# Patient Record
Sex: Female | Born: 1970 | Race: Black or African American | Hispanic: No | Marital: Single | Smoking: Never smoker
Health system: Southern US, Community
[De-identification: ages and names within clinical notes are randomized; demographics above are authoritative.]

## PROBLEM LIST (undated history)

## (undated) DIAGNOSIS — E061 Subacute thyroiditis: Secondary | ICD-10-CM

## (undated) DIAGNOSIS — D649 Anemia, unspecified: Secondary | ICD-10-CM

---

## 1990-06-26 HISTORY — PX: WISDOM TOOTH EXTRACTION: SHX21

## 2000-03-30 ENCOUNTER — Other Ambulatory Visit: Admission: RE | Admit: 2000-03-30 | Discharge: 2000-03-30 | Payer: Self-pay | Admitting: Obstetrics and Gynecology

## 2001-06-03 ENCOUNTER — Other Ambulatory Visit: Admission: RE | Admit: 2001-06-03 | Discharge: 2001-06-03 | Payer: Self-pay | Admitting: Obstetrics and Gynecology

## 2002-05-28 ENCOUNTER — Other Ambulatory Visit: Admission: RE | Admit: 2002-05-28 | Discharge: 2002-05-28 | Payer: Self-pay | Admitting: Obstetrics and Gynecology

## 2003-07-28 ENCOUNTER — Other Ambulatory Visit: Admission: RE | Admit: 2003-07-28 | Discharge: 2003-07-28 | Payer: Self-pay | Admitting: Obstetrics and Gynecology

## 2004-03-16 ENCOUNTER — Other Ambulatory Visit: Admission: RE | Admit: 2004-03-16 | Discharge: 2004-03-16 | Payer: Self-pay | Admitting: Obstetrics and Gynecology

## 2004-10-18 ENCOUNTER — Other Ambulatory Visit: Admission: RE | Admit: 2004-10-18 | Discharge: 2004-10-18 | Payer: Self-pay | Admitting: Obstetrics and Gynecology

## 2007-06-27 HISTORY — PX: MYOMECTOMY: SHX85

## 2007-10-10 ENCOUNTER — Inpatient Hospital Stay (HOSPITAL_COMMUNITY): Admission: RE | Admit: 2007-10-10 | Discharge: 2007-10-12 | Payer: Self-pay | Admitting: Obstetrics and Gynecology

## 2007-10-10 ENCOUNTER — Encounter (INDEPENDENT_AMBULATORY_CARE_PROVIDER_SITE_OTHER): Payer: Self-pay | Admitting: Obstetrics and Gynecology

## 2010-11-08 NOTE — Discharge Summary (Signed)
Christy Rice, Christy Rice                  ACCOUNT NO.:  [id number]   MEDICAL RECORD NO.:  [id number]          PATIENT TYPE:  INP   LOCATION:  9312                          FACILITY:  [facility]   PHYSICIAN:  Dineen Kid. Rana Snare, M.D.    DATE OF BIRTH:  [birth year]   DATE OF ADMISSION:  10/10/2007  DATE OF DISCHARGE:  10/12/2007                               DISCHARGE SUMMARY   HISTORY OF PRESENT ILLNESS:  Christy Rice is a 40 year old nulligravida  with a worsening dysmenorrhea, pelvic pressure pain, and menorrhagia.  She has 12-week size fibroid uterus on pelvic exam and also ultrasound  evaluation with the large fibroid measured 6.9 cm in size and multiple 3-  cm size fibroid.  She presents for laparotomy with myomectomy.   HOSPITAL COURSE:  The patient underwent a laparotomy with multiple  myomectomies.  Surgery was uncomplicated.  Her estimated blood loss  during the procedure was 600 mL.  Her postoperative care was significant  for anemia with her postop day #1, hemoglobin 8.2 with mild  thrombocytopenia of 143.  On postop day #1, she was able to ambulate  without difficulty.  She did complain of some bladder spasm and pain  with urination.  Cath urine was negative and she remained afebrile.  Had  a normal white count.  She was put on Pyridium with good results.  By  postop day #2, she was tolerating a regular diet, passing flatus,  ambulating without difficulty.  Abdomen:  Soft, nontender, and  nondistended.  No rebound or guarding.  The incisions clean, dry, and  intact.  Her hemoglobin on postop day #2, had dropped slightly to 7.5,  platelets of 133.  The patient did desired discharge home and so was  released.   DISPOSITION:  The patient was discharged to home.  Followup in the  office in 2-3 days for follow up CBC and staple removal.  She was sent  home with a prescription for Tylox #30, Motrin 18 mg #30 and iron twice  a day.  I told to return for increased pain, fever, or bleeding.      Dineen Kid Rana Snare, M.D.  Electronically Signed     DCL/MEDQ  D:  10/12/2007  T:  10/12/2007  Job:  [id number]

## 2010-11-08 NOTE — Op Note (Signed)
NAMEABBIEGAIL, LANDGREN                  ACCOUNT NO.:  [id number]   MEDICAL RECORD NO.:  [id number]          PATIENT TYPE:  AMB   LOCATION:  SDC                           FACILITY:  [facility]   PHYSICIAN:  Dineen Kid. Rana Snare, M.D.    DATE OF BIRTH:  July 21, 1970   DATE OF PROCEDURE:  10/10/2007  DATE OF DISCHARGE:                               OPERATIVE REPORT   PREOPERATIVE DIAGNOSES:  Menorrhagia, pelvic pain, and 12-week size  fibroids and pelvic pressure.   PREOPERATIVE DIAGNOSES:  Menorrhagia, pelvic pain, and 12-week size  fibroids and pelvic pressure.   PROCEDURE:  Laparotomy with myomectomy.   SURGEON:  Dineen Kid. Rana Snare, M.D.   ASSISTANTFreddy Finner, M.D.   ANESTHESIA:  General endotracheal.   INDICATIONS:  Ms. Phillis is a 40 year old nulligravida with worsening  problems with dysmenorrhea, pelvic pressure, with pelvic pain and  menorrhagia, 4-week size fibroids based on pelvic exam and also an  ultrasound evaluation, with the largest fibroid measuring 6.9 cm in size  and several 3 cm fibroids.  She desires preservation of the uterus and  presents today for definitive surgical treatment plan with laparotomy  with myomectomy.  The risks and benefits of this procedure were  discussed at length.  Informed consent was obtained.  See history and  physical for further details.   FINDINGS AT THE TIME OF SURGERY:  Multiple fibroids, the largest was  approximately 7 cm in size which was intramural with extension to the  myometrium, several 3 cm fibroids, and several 1 and 2 cm fibroids.  Otherwise normal-appearing fallopian tubes and ovaries.   DESCRIPTION OF PROCEDURE:  After adequate analgesia the patient was  placed in the supine position.  She was sterilely prepped and draped.  Bladder was sterilely drained with a Foley catheter.  A Pfannenstiel  skin incision was made 2 fingerbreadths above the pubic symphysis and  taken down sharply to fascia which was incised transversely and  extended  superiorly and inferiorly off the bellies of the rectus muscle which was  separated sharply in the midline.  Peritoneum was entered sharply.  Examination of the uterus and fallopian tubes was carried out with  normal-appearing ovaries and fallopian tubes.  The fundus of the uterus  was grasped and delivered through the incision and wet packs were placed  around the inferior portion of the uterus.  A 3 cm fibroid was noted  just to the left of the midline below the bladder flap.  A small bladder  flap was created with Metzenbaum scissors.  The fibroid was dissected  using Bovie cautery, grasped with the towel clamp, and the base was  dissected out with Bovie cautery.  Sutures of 0 Monocryl were used to  close the myotomy with good approximation and good hemostasis achieved.  The uterine vesical flap was closed using 2-0 Monocryl.  Several small  subserosal and intramural fibroids were noted along the anterior  surface, were dissected using Bovie cautery, removed after dissection,  and then closed with figure-of-eights of 0 Monocryl suture.  A vertical  incision across the  fundus of the uterus approximately 7-8 cm in length  was used to dissect down to the largest fibroid, was grasped with a  towel clamp, approximately 7-8 cm in size.  It was easily removed, the  base cauterized with the Bovie cautery.  Several small 1-3 cm fibroids  were also noted through that incision and were easily removed through  the same incision using Bovie cautery for hemostasis.  The endometrium  was noted at the base of where the largest fibroid was removed.  It was  entered at the fundal portion of the endometrium.  The endometrium was  grasped with Allis clamps and was closed with a 0 Monocryl suture.  The  intramural portion of the myotomy was then closed with figure-of-eights  of 0 Monocryl suture with good approximation and good hemostasis  achieved, and then the serosal layer was closed with a  running 2-0  Monocryl suture, with good approximation and good hemostasis noted.  Examination of the entire uterus at this time did not reveal any further  fibroids.  The fallopian tube did appear to enter below where the  largest fibroid was and did not appear to be near the area of  dissection.  After a copious amount of irrigation, adequate hemostasis  was assured.  Interceed adhesion barrier was placed across the  incisions.  The uterus was placed back into the abdominal cavity.  The  peritoneum was then closed with 2-0 Monocryl in a running fashion.  Rectus muscle plicated in the midline.  The fascia was then closed with  2 sutures of 0 Vicryl in a running fashion.  Irrigation was applied and  after adequate hemostasis skin staples.  The patient tolerated the  procedure well and was stable on transfer to the recovery room.  Sponge  and needle count was normal x3.  The patient received 900 mg of  clindamycin preoperatively and also Flagyl preoperatively.   BLOOD LOSS FOR THE PROCEDURE:  600 mL.   SPONGE AND NEEDLE COUNTS:  Normal x3.      Dineen Kid Rana Snare, M.D.  Electronically Signed     DCL/MEDQ  D:  10/10/2007  T:  10/10/2007  Job:  [id number]

## 2010-11-08 NOTE — H&P (Signed)
NAMEICIS, Christy Rice                  ACCOUNT NO.:  [id number]   MEDICAL RECORD NO.:  [id number]           PATIENT TYPE:   LOCATION:                                 FACILITY:   PHYSICIAN:  Dineen Kid. Rana Snare, M.D.    DATE OF BIRTH:  06/20/1971   DATE OF ADMISSION:  10/10/2007  DATE OF DISCHARGE:                              HISTORY & PHYSICAL   HISTORY OF PRESENT ILLNESS:  Christy Rice is a 40 year old gravida 0 with  worsening problems with dysmenorrhea, pelvic pressure and menorrhagia.  She has 12-week size fibroids based on pelvic exam and also on  ultrasound evaluation.  She desires definitive surgical intervention.  She desires preservation of the uterus and presents for laparotomy with  myomectomy.  She underwent a saline infusion ultrasound on August 15, 2007 which shows multiple fibroids, the largest measuring 6.9 cm in size  with several 3-cm fibroids.  Most of these are intramural.  She does  have several subserosal fibroids measuring 3.6 cm in size.  There are no  intracavitary masses, however, and the ovaries appear to be normal.   PAST MEDICAL HISTORY:  Negative.   PAST SURGICAL HISTORY:  Negative.   ALLERGIES:  SHE DOES HAVE AN ALLERGY TO AMOXICILLIN AND SULFA.  HOWEVER,  SHE HAS BEEN ABLE TO TAKE CEPHALOSPORINS WITHOUT PROBLEMS.   Currently she is on birth control pills.   PHYSICAL EXAM:  Her blood pressure is 124/70.  Her weight is 189.  HEART:  Regular rate and rhythm.  LUNGS:  Clear to auscultation bilaterally.  The abdomen was nondistended, nontender.  PELVIC EXAM:  The uterus is felt just above the pubic brim with multiple  fibroids noted.   IMPRESSION AND PLAN:  Dysmenorrhea, menorrhagia, pelvic pressure, 12-  week size fibroids.  The patient desires definitive surgical  intervention.  Would plan laparotomy with myomectomy.  The risks and  benefits of the procedure were discussed at length which include but are  not limited to risk of infection, bleeding,  damage to the uterus, tubes,  ovaries,     bowel, bladder.  Possibly this may not alleviate the pain;  it could recur, it could worsen, fibroids can grow back.  There is a  potential for scar tissue.  Potential need for hysterectomy, blood  transfusion, and the risks associated with anesthesia.  She also  understands that if she does conceive she will require a cesarean  section.  She does give her informed consent and wishes to proceed.  Her  last hemoglobin was 14.      Dineen Kid Rana Snare, M.D.  Electronically Signed    DCL/MEDQ  D:  10/09/2007  T:  10/10/2007  Job:  [id number]

## 2011-03-21 LAB — URINALYSIS, ROUTINE W REFLEX MICROSCOPIC
Bilirubin Urine: NEGATIVE
Glucose, UA: NEGATIVE
Hgb urine dipstick: NEGATIVE
Ketones, ur: NEGATIVE
Nitrite: NEGATIVE
Specific Gravity, Urine: <1.005 — ABNORMAL LOW
pH: 5.5

## 2011-03-21 LAB — COMPREHENSIVE METABOLIC PANEL
Albumin: 3.9
Alkaline Phosphatase: 50
BUN: 7
Calcium: 9.5
Potassium: 4
Sodium: 139
Total Protein: 7.4

## 2011-03-21 LAB — CBC
HCT: 21.7 — ABNORMAL LOW
HCT: 23.9 — ABNORMAL LOW
Hemoglobin: 8.2 — ABNORMAL LOW
MCHC: 33.7
MCV: 84.3
Platelets: 133 — ABNORMAL LOW
Platelets: 143 — ABNORMAL LOW
Platelets: 230
RDW: 13.4
WBC: 10.6 — ABNORMAL HIGH
WBC: 6.6

## 2011-03-21 LAB — PREGNANCY, URINE: Preg Test, Ur: NEGATIVE

## 2012-07-16 ENCOUNTER — Other Ambulatory Visit (HOSPITAL_COMMUNITY): Payer: Self-pay | Admitting: Obstetrics and Gynecology

## 2012-07-16 DIAGNOSIS — N979 Female infertility, unspecified: Secondary | ICD-10-CM

## 2012-07-18 ENCOUNTER — Ambulatory Visit (HOSPITAL_COMMUNITY)
Admission: RE | Admit: 2012-07-18 | Discharge: 2012-07-18 | Disposition: A | Discharge status: Home / Self Care | Payer: 59 | Source: Ambulatory Visit | Attending: Obstetrics and Gynecology | Admitting: Obstetrics and Gynecology

## 2012-07-18 DIAGNOSIS — N979 Female infertility, unspecified: Secondary | ICD-10-CM | POA: Insufficient documentation

## 2012-07-18 MED ORDER — IOHEXOL 300 MG/ML  SOLN
20.0000 mL | Freq: Once | INTRAMUSCULAR | Status: AC | PRN
  Administered 2012-07-18: 20 mL

## 2013-02-21 ENCOUNTER — Encounter: Payer: 59 | Admitting: Nurse Practitioner

## 2013-04-30 ENCOUNTER — Other Ambulatory Visit: Payer: Self-pay | Admitting: Nurse Practitioner

## 2013-04-30 DIAGNOSIS — Z0000 Encounter for general adult medical examination without abnormal findings: Secondary | ICD-10-CM

## 2013-05-01 ENCOUNTER — Other Ambulatory Visit (INDEPENDENT_AMBULATORY_CARE_PROVIDER_SITE_OTHER): Payer: 59

## 2013-05-01 DIAGNOSIS — Z0000 Encounter for general adult medical examination without abnormal findings: Secondary | ICD-10-CM

## 2013-05-01 LAB — POCT CBC
MCHC: 33.2 g/dL (ref 31.8–35.4)
MPV: 9.2 fL (ref 0–99.8)
POC Granulocyte: 2.5 (ref 2–6.9)
POC LYMPH PERCENT: 38.2 %L (ref 10–50)
Platelet Count, POC: 184 10*3/uL (ref 142–424)
RDW, POC: 13.4 %
WBC: 4.3 10*3/uL — AB (ref 4.6–10.2)

## 2013-05-03 LAB — CMP14+EGFR
ALT: 8 IU/L (ref 0–32)
Calcium: 9.7 mg/dL (ref 8.7–10.2)
Chloride: 104 mmol/L (ref 97–108)
Glucose: 101 mg/dL — ABNORMAL HIGH (ref 65–99)
Potassium: 5.1 mmol/L (ref 3.5–5.2)
Total Protein: 6.7 g/dL (ref 6.0–8.5)

## 2013-05-03 LAB — NMR, LIPOPROFILE
HDL Cholesterol by NMR: 43 mg/dL (ref >=40)
LDL Particle Number: 1933 nmol/L — ABNORMAL HIGH (ref <1000)
LDL Size: 20.6 nm (ref >20.5)
Triglycerides by NMR: 103 mg/dL (ref <150)

## 2013-06-17 ENCOUNTER — Ambulatory Visit: Payer: 59 | Admitting: Family Medicine

## 2013-09-09 ENCOUNTER — Ambulatory Visit (INDEPENDENT_AMBULATORY_CARE_PROVIDER_SITE_OTHER): Payer: 59

## 2013-09-09 DIAGNOSIS — Z111 Encounter for screening for respiratory tuberculosis: Secondary | ICD-10-CM

## 2013-09-11 ENCOUNTER — Encounter: Payer: Self-pay | Admitting: *Deleted

## 2013-09-11 LAB — TB SKIN TEST
Induration: 0 mm
TB Skin Test: NEGATIVE

## 2014-04-10 ENCOUNTER — Encounter: Payer: Self-pay | Admitting: *Deleted

## 2014-10-30 ENCOUNTER — Other Ambulatory Visit (HOSPITAL_COMMUNITY): Payer: Self-pay | Admitting: Obstetrics and Gynecology

## 2014-11-02 LAB — CYTOLOGY - PAP

## 2014-11-17 ENCOUNTER — Other Ambulatory Visit: Payer: Self-pay | Admitting: Obstetrics and Gynecology

## 2015-07-25 ENCOUNTER — Emergency Department
Admission: EM | Admit: 2015-07-25 | Discharge: 2015-07-25 | Disposition: A | Discharge status: Home / Self Care | Payer: BLUE CROSS/BLUE SHIELD | Source: Home / Self Care | Attending: Family Medicine | Admitting: Family Medicine

## 2015-07-25 ENCOUNTER — Encounter: Payer: Self-pay | Admitting: Emergency Medicine

## 2015-07-25 DIAGNOSIS — J069 Acute upper respiratory infection, unspecified: Secondary | ICD-10-CM | POA: Diagnosis not present

## 2015-07-25 DIAGNOSIS — H65191 Other acute nonsuppurative otitis media, right ear: Secondary | ICD-10-CM

## 2015-07-25 MED ORDER — FLUTICASONE PROPIONATE 50 MCG/ACT NA SUSP: 2.0000 | Freq: Every day | NASAL | Status: DC

## 2015-07-25 MED ORDER — PSEUDOEPHEDRINE HCL 60 MG PO TABS: 60.0000 mg | ORAL_TABLET | Freq: Four times a day (QID) | ORAL | Status: DC | PRN

## 2015-07-25 MED ORDER — AMOXICILLIN-POT CLAVULANATE 875-125 MG PO TABS: 1.0000 | ORAL_TABLET | Freq: Two times a day (BID) | ORAL | Status: DC

## 2015-07-25 NOTE — ED Notes (Signed)
Pt c/o bilateral ear pain, right ear worse x 4 days, head congestion, facial pressure, no cough.

## 2015-07-25 NOTE — ED Provider Notes (Signed)
CSN: [id number]     Arrival date & time 07/25/15  1105 History   First MD Initiated Contact with Patient 07/25/15 1108     Chief Complaint  Patient presents with  . Otalgia   (Consider location/radiation/quality/duration/timing/severity/associated sxs/prior Treatment) HPI  Pt is a 45yo female presenting to [facility] with c/o 4-5 day hx of gradually worsening sinus congestion with bilateral ear pain, worse in Right ear, aching and throbbing, associated facial pressure.  Pain is 4/10 at worst. Denies cough, chest pain or SOB.  She has had chills but no recorded fever. Denies n/v/d.  She has been taking ibuprofen with temporary relief. No sick contacts or recent travel.   History reviewed. No pertinent past medical history. History reviewed. No pertinent past surgical history. No family history on file. Social History  Substance Use Topics  . Smoking status: Never Smoker   . Smokeless tobacco: None  . Alcohol Use: None   OB History    No data available     Review of Systems  Constitutional: Positive for chills. Negative for fever.  HENT: Positive for congestion, ear pain (Right worse than Left), postnasal drip, rhinorrhea and sinus pressure. Negative for sore throat, trouble swallowing and voice change.   Respiratory: Negative for cough and shortness of breath.   Cardiovascular: Negative for chest pain and palpitations.  Gastrointestinal: Negative for nausea, vomiting, abdominal pain and diarrhea.  Musculoskeletal: Negative for myalgias, back pain and arthralgias.  Skin: Negative for rash.    Allergies  Sulfa antibiotics  Home Medications   Prior to Admission medications   Medication Sig Start Date End Date Taking? Authorizing Provider  Multiple Vitamin (MULTIVITAMIN) tablet Take 1 tablet by mouth daily.   Yes Historical Provider, MD  amoxicillin-clavulanate (AUGMENTIN) 875-125 MG tablet Take 1 tablet by mouth 2 (two) times daily. One po bid x 7 days 07/25/15   Noland Fordyce, PA-C   fluticasone [facility]) 50 MCG/ACT nasal spray Place 2 sprays into both nostrils daily. 07/25/15   Noland Fordyce, PA-C  pseudoephedrine (SUDAFED) 60 MG tablet Take 1 tablet (60 mg total) by mouth every 6 (six) hours as needed for congestion. 07/25/15   Noland Fordyce, PA-C   Meds Ordered and Administered this Visit  Medications - No data to display  BP 136/77 mmHg  Pulse 105  Temp(Src) 99.3 F (37.4 C) (Oral)  Ht 5\' 5"  (1.651 m)  Wt 193 lb 4 oz (87.658 kg)  BMI 32.16 kg/m2  SpO2 98%  LMP 07/23/2015 No data found.   Physical Exam  Constitutional: She appears well-developed and well-nourished. No distress.  HENT:  Head: Normocephalic and atraumatic.  Right Ear: Hearing, external ear and ear canal normal. Tympanic membrane is injected.  Left Ear: Hearing, tympanic membrane, external ear and ear canal normal.  Nose: Mucosal edema and rhinorrhea present. Right sinus exhibits maxillary sinus tenderness. Right sinus exhibits no frontal sinus tenderness. Left sinus exhibits no maxillary sinus tenderness and no frontal sinus tenderness.  Mouth/Throat: Uvula is midline, oropharynx is clear and moist and mucous membranes are normal.  Eyes: Conjunctivae are normal. No scleral icterus.  Neck: Normal range of motion. Neck supple.  Cardiovascular: Normal rate, regular rhythm and normal heart sounds.   Pulmonary/Chest: Effort normal and breath sounds normal. No stridor. No respiratory distress. She has no wheezes. She has no rales. She exhibits no tenderness.  Abdominal: Soft. She exhibits no distension. There is no tenderness.  Musculoskeletal: Normal range of motion.  Lymphadenopathy:    She has cervical adenopathy.  Neurological: She is alert.  Skin: Skin is warm and dry. She is not diaphoretic.  Nursing note and vitals reviewed.   ED Course  Procedures (including critical care time)  Labs Review Labs Reviewed - No data to display  Imaging Review No results found.    Tympanometry:  Right ear- Positive Tympanometric peak pressure.  Left ear- normal  MDM   1. Acute nonsuppurative otitis media of right ear   2. Acute upper respiratory infection     Pt c/o 4-5 days of worsening nasal congestion with bilateral ear pain, worse in Right ear.  Tympanometry c/w early onset AOM   Rx: augmentin, flonase, and sudafed.  Advised pt to use acetaminophen and ibuprofen as needed for fever and pain. Encouraged rest and fluids. F/u with PCP in 7-10 days if not improving, sooner if worsening. Pt verbalized understanding and agreement with tx plan.     Noland Fordyce, PA-C 07/25/15 1141

## 2015-07-25 NOTE — Discharge Instructions (Signed)
You may take 400-600mg Ibuprofen (Motrin) every 6-8 hours for fever and pain  °Alternate with Tylenol  °You may take 500mg Tylenol every 4-6 hours as needed for fever and pain  °Follow-up with your primary care provider next week for recheck of symptoms if not improving.  °Be sure to drink plenty of fluids and rest, at least 8hrs of sleep a night, preferably more while you are sick. °Return urgent care or go to closest ER if you cannot keep down fluids/signs of dehydration, fever not reducing with Tylenol, difficulty breathing/wheezing, stiff neck, worsening condition, or other concerns (see below)  °Please take antibiotics as prescribed and be sure to complete entire course even if you start to feel better to ensure infection does not come back. ° ° °Cool Mist Vaporizers °Vaporizers may help relieve the symptoms of a cough and cold. They add moisture to the air, which helps mucus to become thinner and less sticky. This makes it easier to breathe and cough up secretions. Cool mist vaporizers do not cause serious burns like hot mist vaporizers, which may also be called steamers or humidifiers. Vaporizers have not been proven to help with colds. You should not use a vaporizer if you are allergic to mold. °HOME CARE INSTRUCTIONS °· Follow the package instructions for the vaporizer. °· Do not use anything other than distilled water in the vaporizer. °· Do not run the vaporizer all of the time. This can cause mold or bacteria to grow in the vaporizer. °· Clean the vaporizer after each time it is used. °· Clean and dry the vaporizer well before storing it. °· Stop using the vaporizer if worsening respiratory symptoms develop. °  °This information is not intended to replace advice given to you by your health care provider. Make sure you discuss any questions you have with your health care provider. °  °Document Released: 03/09/2004 Document Revised: 06/17/2013 Document Reviewed: 10/30/2012 °Elsevier Interactive Patient  Education ©2016 [employer] ° °

## 2015-07-30 ENCOUNTER — Emergency Department
Admission: EM | Admit: 2015-07-30 | Discharge: 2015-07-30 | Disposition: A | Discharge status: Home / Self Care | Payer: Self-pay | Source: Home / Self Care

## 2015-07-30 DIAGNOSIS — Z111 Encounter for screening for respiratory tuberculosis: Secondary | ICD-10-CM

## 2015-07-30 NOTE — ED Notes (Signed)
patient is here fore PPD placement, placed in LFA.

## 2015-08-01 ENCOUNTER — Emergency Department
Admission: EM | Admit: 2015-08-01 | Discharge: 2015-08-01 | Disposition: A | Discharge status: Home / Self Care | Payer: BLUE CROSS/BLUE SHIELD | Source: Home / Self Care

## 2015-08-01 MED ORDER — TUBERCULIN PPD 5 UNIT/0.1ML ID SOLN: 5.0000 [IU] | Freq: Once | INTRADERMAL | Status: DC

## 2015-08-01 NOTE — ED Notes (Signed)
Ppd reading

## 2015-09-09 NOTE — Patient Instructions (Signed)
Your procedure is scheduled on:  Tuesday, September 21, 2015  Enter through the Main Entrance of [facility] at:  6:00 AM  Pick up the phone at the desk and dial [phone].  Call this number if you have problems the morning of surgery: [phone].  Remember: Do NOT eat food or drink after:  Midnight Monday  Take these medicines the morning of surgery with a SIP OF WATER:  None  Do NOT wear jewelry (body piercing), metal hair clips/bobby pins, make-up, or nail polish. Do NOT wear lotions, powders, or perfumes.  You may wear deoderant. Do NOT shave for 48 hours prior to surgery. Do NOT bring valuables to the hospital. Contacts, dentures, or bridgework may not be worn into surgery.  Leave suitcase in car.  After surgery it may be brought to your room.  For patients admitted to the hospital, checkout time is 11:00 AM the day of discharge.

## 2015-09-10 ENCOUNTER — Encounter (HOSPITAL_COMMUNITY): Payer: Self-pay

## 2015-09-10 ENCOUNTER — Encounter (HOSPITAL_COMMUNITY)
Admission: RE | Admit: 2015-09-10 | Discharge: 2015-09-10 | Disposition: A | Discharge status: Home / Self Care | Payer: BLUE CROSS/BLUE SHIELD | Source: Ambulatory Visit | Attending: Obstetrics and Gynecology | Admitting: Obstetrics and Gynecology

## 2015-09-10 DIAGNOSIS — D259 Leiomyoma of uterus, unspecified: Secondary | ICD-10-CM | POA: Insufficient documentation

## 2015-09-10 DIAGNOSIS — Z01812 Encounter for preprocedural laboratory examination: Secondary | ICD-10-CM | POA: Insufficient documentation

## 2015-09-10 DIAGNOSIS — N939 Abnormal uterine and vaginal bleeding, unspecified: Secondary | ICD-10-CM | POA: Diagnosis not present

## 2015-09-10 DIAGNOSIS — Z01810 Encounter for preprocedural cardiovascular examination: Secondary | ICD-10-CM | POA: Insufficient documentation

## 2015-09-10 HISTORY — DX: Anemia, unspecified: D64.9

## 2015-09-10 HISTORY — DX: Subacute thyroiditis: E06.1

## 2015-09-10 LAB — CBC
HEMATOCRIT: 36.4 % (ref 36.0–46.0)
Hemoglobin: 11.7 g/dL — ABNORMAL LOW (ref 12.0–15.0)
MCH: 26.8 pg (ref 26.0–34.0)
MCHC: 32.1 g/dL (ref 30.0–36.0)
MCV: 83.5 fL (ref 78.0–100.0)
PLATELETS: 272 10*3/uL (ref 150–400)
RBC: 4.36 MIL/uL (ref 3.87–5.11)
RDW: 15.5 % (ref 11.5–15.5)
WBC: 3.4 10*3/uL — AB (ref 4.0–10.5)

## 2015-09-10 LAB — ABO/RH: ABO/RH(D): A POS

## 2015-09-16 ENCOUNTER — Emergency Department (INDEPENDENT_AMBULATORY_CARE_PROVIDER_SITE_OTHER)
Admission: EM | Admit: 2015-09-16 | Discharge: 2015-09-16 | Disposition: A | Discharge status: Home / Self Care | Payer: BLUE CROSS/BLUE SHIELD | Source: Home / Self Care | Attending: Family Medicine | Admitting: Family Medicine

## 2015-09-16 ENCOUNTER — Emergency Department (INDEPENDENT_AMBULATORY_CARE_PROVIDER_SITE_OTHER): Payer: BLUE CROSS/BLUE SHIELD

## 2015-09-16 DIAGNOSIS — X58XXXA Exposure to other specified factors, initial encounter: Secondary | ICD-10-CM

## 2015-09-16 DIAGNOSIS — S92912A Unspecified fracture of left toe(s), initial encounter for closed fracture: Secondary | ICD-10-CM

## 2015-09-16 DIAGNOSIS — S92512A Displaced fracture of proximal phalanx of left lesser toe(s), initial encounter for closed fracture: Secondary | ICD-10-CM

## 2015-09-16 NOTE — ED Provider Notes (Signed)
CSN: [id number]     Arrival date & time 09/16/15  1759 History   First MD Initiated Contact with Patient 09/16/15 1840     Chief Complaint  Patient presents with  . Fall      HPI Comments: Patient slipped on stairs at 10pm yesterday, injuring her left foot.  Patient is a 45 y.o. female presenting with foot injury. The history is provided by the patient.  Foot Injury Location:  Foot Time since incident:  20 hours Injury: yes   Foot location:  L foot Pain details:    Quality:  Aching   Radiates to:  Does not radiate   Severity:  Moderate   Onset quality:  Gradual   Duration:  20 hours   Timing:  Constant   Progression:  Unchanged Chronicity:  New Prior injury to area:  No Worsened by:  Bearing weight Ineffective treatments:  None tried Associated symptoms: decreased ROM, stiffness and swelling   Associated symptoms: no back pain, no muscle weakness, no numbness and no tingling     Past Medical History  Diagnosis Date  . Thyroiditis, subacute   . Anemia    Past Surgical History  Procedure Laterality Date  . Myomectomy  2009  . Wisdom tooth extraction  1992   History reviewed. No pertinent family history. Social History  Substance Use Topics  . Smoking status: Never Smoker   . Smokeless tobacco: Never Used  . Alcohol Use: No   OB History    No data available     Review of Systems  Musculoskeletal: Positive for stiffness. Negative for back pain.  All other systems reviewed and are negative.   Allergies  Sulfa antibiotics  Home Medications   Prior to Admission medications   Medication Sig Start Date End Date Taking? Authorizing Provider  Calcium 500 MG CHEW Chew 1 tablet by mouth 3 (three) times a week.    Historical Provider, MD  Ergocalciferol (VITAMIN D2) 2000 units TABS Take 1 tablet by mouth daily.    Historical Provider, MD  fluticasone (FLONASE) 50 MCG/ACT nasal spray Place 2 sprays into both nostrils daily. Patient not taking: Reported on 09/03/2015  07/25/15   Noland Fordyce, PA-C  Multiple Vitamin (MULTIVITAMIN) tablet Take 1 tablet by mouth daily.    Historical Provider, MD   Meds Ordered and Administered this Visit  Medications - No data to display  BP 116/79 mmHg  Pulse 87  Temp(Src) 98.3 F (36.8 C) (Oral)  Ht 5\' 5"  (1.651 m)  Wt 186 lb 8 oz (84.596 kg)  BMI 31.04 kg/m2  SpO2 98%  LMP 09/10/2015 (Exact Date) No data found.   Physical Exam  Constitutional: She is oriented to person, place, and time. She appears well-developed and well-nourished. No distress.  HENT:  Head: Atraumatic.  Eyes: Pupils are equal, round, and reactive to light.  Musculoskeletal:       Left foot: There is decreased range of motion, tenderness, bony tenderness and swelling. There is normal capillary refill, no crepitus, no deformity and no laceration.       Feet:  There is tenderness to palpation and swelling over the left fifth toe and MTP joint.  Distal neurovascular function is intact.   Neurological: She is alert and oriented to person, place, and time.  Skin: Skin is warm and dry.  Nursing note and vitals reviewed.   ED Course  Procedures none   Imaging Review Dg Foot Complete Left  09/16/2015  CLINICAL DATA:  [place] [address] on stairs  last night with pain 4th to 5th digits EXAM: LEFT FOOT - COMPLETE 3+ VIEW COMPARISON:  None. FINDINGS: There is an insignificantly displaced fracture involving the medial base of the fifth proximal phalanx. Fracture line extends into the metatarsophalangeal joint. There is also an insignificantly displaced fracture involving the medial base of the medial phalanx with fracture extending into the proximal interphalangeal joint. IMPRESSION: Fractures of the fifth proximal and middle phalanges. Electronically Signed   By: Skipper Cliche M.D.   On: 09/16/2015 18:53      MDM   1. Closed fracture of proximal phalanx of toe of left foot    Ace wrap applied.  Dispensed post-op shoe. Apply ice pack for 15 minutes every 1  to 2 hours today and tomorrow.  Elevate.  Wear Ace wrap until swelling decreases.  Wear post-op shoe for protection.  May continue ibuprofen. Followup with Dr. Aundria Mems or Dr. Lynne Leader ([facility]) in about one week.    Kandra Nicolas, MD 09/23/15 1215

## 2015-09-16 NOTE — Discharge Instructions (Signed)
Apply ice pack for 15 minutes every 1 to 2 hours today and tomorrow.  Elevate.  Wear Ace wrap until swelling decreases.  Wear post-op shoe for protection.  May continue ibuprofen.   Toe Fracture A toe fracture is a break in one of the toe bones (phalanges). CAUSES This condition may be caused by:  Dropping a heavy object on your toe.  Stubbing your toe.  Overusing your toe or doing repetitive exercise.  Twisting or stretching your toe out of place. RISK FACTORS This condition is more likely to develop in people who:  Play contact sports.  Have a bone disease.  Have a low calcium level. SYMPTOMS The main symptoms of this condition are swelling and pain in the toe. The pain may get worse with standing or walking. Other symptoms include:  Bruising.  Stiffness.  Numbness.  A change in the way the toe looks.  Broken bones that poke through the skin.  Blood beneath the toenail. DIAGNOSIS This condition is diagnosed with a physical exam. You may also have X-rays. TREATMENT  Treatment for this condition depends on the type of fracture and its severity. Treatment may involve:  Taping the broken toe to a toe that is next to it (buddy taping). This is the most common treatment for fractures in which the bone has not moved out of place (nondisplaced fracture).  Wearing a shoe that has a wide, rigid sole to protect the toe and to limit its movement.  Wearing a walking cast.  Having a procedure to move the toe back into place.  Surgery. This may be needed:  If there are many pieces of broken bone that are out of place (displaced).  If the toe joint breaks.  If the bone breaks through the skin.  Physical therapy. This is done to help regain movement and strength in the toe. You may need follow-up X-rays to make sure that the bone is healing well and staying in position. HOME CARE INSTRUCTIONS If You Have a Cast:  Do not stick anything inside the cast to scratch your  skin. Doing that increases your risk of infection.  Check the skin around the cast every day. Report any concerns to your health care provider. You may put lotion on dry skin around the edges of the cast. Do not apply lotion to the skin underneath the cast.  Do not put pressure on any part of the cast until it is fully hardened. This may take several hours.  Keep the cast clean and dry. Bathing  Do not take baths, swim, or use a hot tub until your health care provider approves. Ask your health care provider if you can take showers. You may only be allowed to take sponge baths for bathing.  If your health care provider approves bathing and showering, cover the cast or bandage (dressing) with a watertight plastic bag to protect it from water. Do not let the cast or dressing get wet. Managing Pain, Stiffness, and Swelling  If you do not have a cast, apply ice to the injured area, if directed.  Put ice in a plastic bag.  Place a towel between your skin and the bag.  Leave the ice on for 20 minutes, 2-3 times per day.  Move your toes often to avoid stiffness and to lessen swelling.  Raise (elevate) the injured area above the level of your heart while you are sitting or lying down. Driving  Do not drive or operate heavy machinery while taking pain  medicine.  Do not drive while wearing a cast on a foot that you use for driving. Activity  Return to your normal activities as directed by your health care provider. Ask your health care provider what activities are safe for you.  Perform exercises daily as directed by your health care provider or physical therapist. Safety  Do not use the injured limb to support your body weight until your health care provider says that you can. Use crutches or other assistive devices as directed by your health care provider. General Instructions  If your toe was treated with buddy taping, follow your health care provider's instructions for changing the  gauze and tape. Change it more often:  The gauze and tape get wet. If this happens, dry the space between the toes.  The gauze and tape are too tight and cause your toe to become pale or numb.  Wear a protective shoe as directed by your health care provider. If you were not given a protective shoe, wear sturdy, supportive shoes. Your shoes should not pinch your toes and should not fit tightly against your toes.  Do not use any tobacco products, including cigarettes, chewing tobacco, or e-cigarettes. Tobacco can delay bone healing. If you need help quitting, ask your health care provider.  Take medicines only as directed by your health care provider.  Keep all follow-up visits as directed by your health care provider. This is important. SEEK MEDICAL CARE IF:  You have a fever.  Your pain medicine is not helping.  Your toe is cold.  Your toe is numb.  You still have pain after one week of rest and treatment.  You still have pain after your health care provider has said that you can start walking again.  You have pain, tingling, or numbness in your foot that is not going away. SEEK IMMEDIATE MEDICAL CARE IF:  You have severe pain.  You have redness or inflammation in your toe that is getting worse.  You have pain or numbness in your toe that is getting worse.  Your toe turns blue.   This information is not intended to replace advice given to you by your health care provider. Make sure you discuss any questions you have with your health care provider.   Document Released: 06/09/2000 Document Revised: 03/03/2015 Document Reviewed: 04/08/2014 Elsevier Interactive Patient Education [employer].

## 2015-09-16 NOTE — ED Notes (Signed)
Walking down the stairs last night, and 3 from the bottom foot went out from under her.  Left foot bruised and tender to the touch and when moving.  Ankle also tight.  Has taken nothing for pain since it happened.

## 2015-09-18 ENCOUNTER — Telehealth: Payer: Self-pay

## 2015-09-20 ENCOUNTER — Encounter (HOSPITAL_COMMUNITY): Payer: Self-pay | Admitting: Anesthesiology

## 2015-09-20 MED ORDER — DEXTROSE 5 % IV SOLN
2.0000 g | INTRAVENOUS | Status: AC
  Administered 2015-09-21: 2 g via INTRAVENOUS
  Filled 2015-09-20: qty 2

## 2015-09-20 NOTE — Anesthesia Preprocedure Evaluation (Addendum)
Anesthesia Evaluation  Patient identified by MRN, date of birth, ID band Patient awake    Reviewed: Allergy & Precautions, H&P , NPO status , Patient's Chart, lab work & pertinent test results  Airway Mallampati: II  TM Distance: >3 FB Neck ROM: full    Dental no notable dental hx.    Pulmonary neg pulmonary ROS,    Pulmonary exam normal        Cardiovascular negative cardio ROS Normal cardiovascular exam     Neuro/Psych negative neurological ROS  negative psych ROS   GI/Hepatic negative GI ROS, Neg liver ROS,   Endo/Other  negative endocrine ROS  Renal/GU negative Renal ROS     Musculoskeletal   Abdominal (+) + obese,   Peds  Hematology   Anesthesia Other Findings   Reproductive/Obstetrics negative OB ROS                            Anesthesia Physical Anesthesia Plan  ASA: II  Anesthesia Plan: General   Post-op Pain Management:    Induction: Intravenous  Airway Management Planned: Oral ETT  Additional Equipment:   Intra-op Plan:   Post-operative Plan: Extubation in OR  Informed Consent: I have reviewed the patients History and Physical, chart, labs and discussed the procedure including the risks, benefits and alternatives for the proposed anesthesia with the patient or authorized representative who has indicated his/her understanding and acceptance.     Plan Discussed with: CRNA and Surgeon  Anesthesia Plan Comments:        Anesthesia Quick Evaluation

## 2015-09-20 NOTE — H&P (Addendum)
  Christy Rice [id number] 09/10/2015 S:  Christy Rice presents today for preop evaluation for myomectomy.  She has had enlargement of her uterine fibroids.  Previously had a myomectomy in 2009.  Most recent ultrasound shows 15 to 16 week fibroids approximately 3 to 4 months ago, ultrasound 12/15 shows 20 week size with most of them in the 3 to 5 cm range, both intramural and subserosal.  She is having pelvic pain, abnormal bleeding and certainly enlargement in growth and mechanical issues. PAST MEDICAL HISTORY:  Negative.  PAST SURGICAL HISTORY:  Myomectomy in 2009.  MEDICATIONS:  Multivitamins and calcium.  ALLERGIES:  Sulfa.  O:  Recent ultrasound as above shows 20 week sized fibroids.  Uterus is palpable to there umbilicus.  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    P:  Enlarging fibroids now 20 week size, symptomatic both from menometorrhagia, abdominal bleeding and pelvic pressure.  Patient desires preservation of the uterus.  Plan laparotomy with myomectomy.  Discussed the risks and benefits and pros and cons.  Discussed risks associated with blood transfusion.  She did give me the judgment call to do a hysterectomy if I felt like it was necessary.  I did discuss down the road if she does get pregnant, consider a cesarean hysterectomy but if we do a myomectomy, she may not even need hysterectomy down the road, but she would require a cesarean section in the future.  All of her questions are answered, and she has given informed consent.   Louretta Shorten, MD/[id number]  09/21/15 0700 This patient has been seen and examined.   All of her questions were answered.  Labs and vital signs reviewed.  Informed consent has been obtained.  The History and Physical is current. DL

## 2015-09-21 ENCOUNTER — Encounter (HOSPITAL_COMMUNITY)
Admission: RE | Disposition: A | Discharge status: Home / Self Care | Payer: Self-pay | Source: Ambulatory Visit | Attending: Obstetrics and Gynecology

## 2015-09-21 ENCOUNTER — Encounter (HOSPITAL_COMMUNITY): Payer: Self-pay

## 2015-09-21 ENCOUNTER — Inpatient Hospital Stay (HOSPITAL_COMMUNITY)
Admission: RE | Admit: 2015-09-21 | Discharge: 2015-09-25 | DRG: 742 | Disposition: A | Discharge status: Home / Self Care | Payer: BLUE CROSS/BLUE SHIELD | Source: Ambulatory Visit | Attending: Obstetrics and Gynecology | Admitting: Obstetrics and Gynecology

## 2015-09-21 ENCOUNTER — Inpatient Hospital Stay (HOSPITAL_COMMUNITY): Payer: BLUE CROSS/BLUE SHIELD | Admitting: Anesthesiology

## 2015-09-21 DIAGNOSIS — R102 Pelvic and perineal pain: Secondary | ICD-10-CM | POA: Diagnosis present

## 2015-09-21 DIAGNOSIS — N921 Excessive and frequent menstruation with irregular cycle: Secondary | ICD-10-CM | POA: Diagnosis present

## 2015-09-21 DIAGNOSIS — N920 Excessive and frequent menstruation with regular cycle: Secondary | ICD-10-CM | POA: Diagnosis present

## 2015-09-21 DIAGNOSIS — D62 Acute posthemorrhagic anemia: Secondary | ICD-10-CM | POA: Diagnosis not present

## 2015-09-21 DIAGNOSIS — E669 Obesity, unspecified: Secondary | ICD-10-CM | POA: Diagnosis present

## 2015-09-21 DIAGNOSIS — K567 Ileus, unspecified: Secondary | ICD-10-CM | POA: Diagnosis not present

## 2015-09-21 DIAGNOSIS — D259 Leiomyoma of uterus, unspecified: Secondary | ICD-10-CM | POA: Diagnosis present

## 2015-09-21 DIAGNOSIS — K5900 Constipation, unspecified: Secondary | ICD-10-CM | POA: Diagnosis present

## 2015-09-21 DIAGNOSIS — Z9889 Other specified postprocedural states: Secondary | ICD-10-CM

## 2015-09-21 HISTORY — PX: MYOMECTOMY: SHX85

## 2015-09-21 HISTORY — PX: SALPINGOOPHORECTOMY: SHX82

## 2015-09-21 LAB — PREGNANCY, URINE: PREG TEST UR: NEGATIVE

## 2015-09-21 SURGERY — MYOMECTOMY, ABDOMINAL APPROACH
Anesthesia: General | Site: Abdomen

## 2015-09-21 MED ORDER — ONDANSETRON HCL 4 MG/2ML IJ SOLN: 4.0000 mg | Freq: Once | INTRAMUSCULAR | Status: DC | PRN

## 2015-09-21 MED ORDER — DEXAMETHASONE SODIUM PHOSPHATE 10 MG/ML IJ SOLN
INTRAMUSCULAR | Status: DC | PRN
  Administered 2015-09-21: 4 mg via INTRAVENOUS

## 2015-09-21 MED ORDER — DIPHENHYDRAMINE HCL 50 MG/ML IJ SOLN: 12.5000 mg | Freq: Four times a day (QID) | INTRAMUSCULAR | Status: DC | PRN

## 2015-09-21 MED ORDER — MENTHOL 3 MG MT LOZG: 1.0000 | LOZENGE | OROMUCOSAL | Status: DC | PRN

## 2015-09-21 MED ORDER — HYDROMORPHONE HCL 1 MG/ML IJ SOLN
0.2500 mg | INTRAMUSCULAR | Status: DC | PRN
  Administered 2015-09-21 (×2): 0.25 mg via INTRAVENOUS
  Administered 2015-09-21: 0.5 mg via INTRAVENOUS

## 2015-09-21 MED ORDER — ONDANSETRON HCL 4 MG/2ML IJ SOLN
INTRAMUSCULAR | Status: AC
  Filled 2015-09-21: qty 2

## 2015-09-21 MED ORDER — DIPHENHYDRAMINE HCL 12.5 MG/5ML PO ELIX: 12.5000 mg | ORAL_SOLUTION | Freq: Four times a day (QID) | ORAL | Status: DC | PRN

## 2015-09-21 MED ORDER — MIDAZOLAM HCL 2 MG/2ML IJ SOLN
INTRAMUSCULAR | Status: DC | PRN
  Administered 2015-09-21: 2 mg via INTRAVENOUS

## 2015-09-21 MED ORDER — DEXAMETHASONE SODIUM PHOSPHATE 4 MG/ML IJ SOLN
INTRAMUSCULAR | Status: AC
  Filled 2015-09-21: qty 1

## 2015-09-21 MED ORDER — LIDOCAINE HCL (CARDIAC) 20 MG/ML IV SOLN
INTRAVENOUS | Status: DC | PRN
  Administered 2015-09-21: 100 mg via INTRAVENOUS

## 2015-09-21 MED ORDER — DEXTROSE-NACL 5-0.45 % IV SOLN
INTRAVENOUS | Status: DC
  Administered 2015-09-21 – 2015-09-22 (×2): via INTRAVENOUS

## 2015-09-21 MED ORDER — FENTANYL CITRATE (PF) 250 MCG/5ML IJ SOLN
INTRAMUSCULAR | Status: AC
  Filled 2015-09-21: qty 5

## 2015-09-21 MED ORDER — OXYCODONE-ACETAMINOPHEN 5-325 MG PO TABS
1.0000 | ORAL_TABLET | ORAL | Status: DC | PRN
  Administered 2015-09-22 – 2015-09-25 (×6): 1 via ORAL
  Filled 2015-09-21 (×7): qty 1

## 2015-09-21 MED ORDER — HYDROMORPHONE HCL 1 MG/ML IJ SOLN: 0.2000 mg | INTRAMUSCULAR | Status: DC | PRN

## 2015-09-21 MED ORDER — DEXTROSE 5 % IV SOLN: 2.0000 g | INTRAVENOUS | Status: DC

## 2015-09-21 MED ORDER — SODIUM CHLORIDE 0.9% FLUSH: 9.0000 mL | INTRAVENOUS | Status: DC | PRN

## 2015-09-21 MED ORDER — IBUPROFEN 600 MG PO TABS
600.0000 mg | ORAL_TABLET | Freq: Four times a day (QID) | ORAL | Status: DC | PRN
  Administered 2015-09-22 – 2015-09-25 (×8): 600 mg via ORAL
  Filled 2015-09-21 (×9): qty 1

## 2015-09-21 MED ORDER — PHENYLEPHRINE 40 MCG/ML (10ML) SYRINGE FOR IV PUSH (FOR BLOOD PRESSURE SUPPORT)
PREFILLED_SYRINGE | INTRAVENOUS | Status: AC
  Filled 2015-09-21: qty 10

## 2015-09-21 MED ORDER — ONDANSETRON HCL 4 MG/2ML IJ SOLN: 4.0000 mg | Freq: Four times a day (QID) | INTRAMUSCULAR | Status: DC | PRN

## 2015-09-21 MED ORDER — VASOPRESSIN 20 UNIT/ML IV SOLN
INTRAVENOUS | Status: AC
  Filled 2015-09-21: qty 1

## 2015-09-21 MED ORDER — ROCURONIUM BROMIDE 100 MG/10ML IV SOLN
INTRAVENOUS | Status: AC
  Filled 2015-09-21: qty 1

## 2015-09-21 MED ORDER — NALOXONE HCL 0.4 MG/ML IJ SOLN: 0.4000 mg | INTRAMUSCULAR | Status: DC | PRN

## 2015-09-21 MED ORDER — HYDROMORPHONE 1 MG/ML IV SOLN
INTRAVENOUS | Status: DC
  Administered 2015-09-21: 11:00:00 via INTRAVENOUS
  Administered 2015-09-21: 1.8 mg via INTRAVENOUS
  Administered 2015-09-22 (×2): 0.2 mg via INTRAVENOUS
  Filled 2015-09-21: qty 25

## 2015-09-21 MED ORDER — SCOPOLAMINE 1 MG/3DAYS TD PT72
MEDICATED_PATCH | TRANSDERMAL | Status: AC
  Filled 2015-09-21: qty 1

## 2015-09-21 MED ORDER — FENTANYL CITRATE (PF) 100 MCG/2ML IJ SOLN
INTRAMUSCULAR | Status: AC
  Filled 2015-09-21: qty 2

## 2015-09-21 MED ORDER — GLYCOPYRROLATE 0.2 MG/ML IJ SOLN
INTRAMUSCULAR | Status: AC
  Filled 2015-09-21: qty 3

## 2015-09-21 MED ORDER — MIDAZOLAM HCL 2 MG/2ML IJ SOLN
INTRAMUSCULAR | Status: AC
  Filled 2015-09-21: qty 2

## 2015-09-21 MED ORDER — MEPERIDINE HCL 25 MG/ML IJ SOLN
6.2500 mg | INTRAMUSCULAR | Status: DC | PRN
  Administered 2015-09-21: 12.5 mg via INTRAVENOUS

## 2015-09-21 MED ORDER — SODIUM CHLORIDE 0.9 % IV SOLN
10000.0000 ug | INTRAVENOUS | Status: DC | PRN
  Administered 2015-09-21: 80 ug via INTRAVENOUS
  Administered 2015-09-21: 40 ug via INTRAVENOUS
  Administered 2015-09-21: 80 ug via INTRAVENOUS
  Administered 2015-09-21: 40 ug via INTRAVENOUS
  Administered 2015-09-21 (×3): 80 ug via INTRAVENOUS

## 2015-09-21 MED ORDER — NEOSTIGMINE METHYLSULFATE 10 MG/10ML IV SOLN
INTRAVENOUS | Status: AC
  Filled 2015-09-21: qty 1

## 2015-09-21 MED ORDER — MEPERIDINE HCL 25 MG/ML IJ SOLN
INTRAMUSCULAR | Status: AC
  Filled 2015-09-21: qty 1

## 2015-09-21 MED ORDER — LACTATED RINGERS IV SOLN
INTRAVENOUS | Status: DC
  Administered 2015-09-21 (×3): via INTRAVENOUS

## 2015-09-21 MED ORDER — SCOPOLAMINE 1 MG/3DAYS TD PT72
1.0000 | MEDICATED_PATCH | Freq: Once | TRANSDERMAL | Status: DC
  Administered 2015-09-21: 1.5 mg via TRANSDERMAL

## 2015-09-21 MED ORDER — HYDROMORPHONE HCL 1 MG/ML IJ SOLN
INTRAMUSCULAR | Status: AC
  Administered 2015-09-21: 0.5 mg via INTRAVENOUS
  Filled 2015-09-21: qty 1

## 2015-09-21 MED ORDER — 0.9 % SODIUM CHLORIDE (POUR BTL) OPTIME
TOPICAL | Status: DC | PRN
  Administered 2015-09-21: 1000 mL

## 2015-09-21 MED ORDER — FENTANYL CITRATE (PF) 100 MCG/2ML IJ SOLN
INTRAMUSCULAR | Status: DC | PRN
  Administered 2015-09-21 (×3): 100 ug via INTRAVENOUS
  Administered 2015-09-21: 50 ug via INTRAVENOUS

## 2015-09-21 MED ORDER — KETOROLAC TROMETHAMINE 30 MG/ML IJ SOLN: 30.0000 mg | Freq: Once | INTRAMUSCULAR | Status: DC

## 2015-09-21 MED ORDER — LIDOCAINE HCL (CARDIAC) 20 MG/ML IV SOLN
INTRAVENOUS | Status: AC
  Filled 2015-09-21: qty 5

## 2015-09-21 MED ORDER — SODIUM CHLORIDE 0.9 % IJ SOLN
INTRAMUSCULAR | Status: AC
  Filled 2015-09-21: qty 100

## 2015-09-21 MED ORDER — ROCURONIUM BROMIDE 100 MG/10ML IV SOLN
INTRAVENOUS | Status: DC | PRN
  Administered 2015-09-21: 30 mg via INTRAVENOUS
  Administered 2015-09-21: 5 mg via INTRAVENOUS
  Administered 2015-09-21: 20 mg via INTRAVENOUS

## 2015-09-21 MED ORDER — PROPOFOL 10 MG/ML IV BOLUS
INTRAVENOUS | Status: DC | PRN
  Administered 2015-09-21: 200 mg via INTRAVENOUS

## 2015-09-21 MED ORDER — PROPOFOL 10 MG/ML IV BOLUS
INTRAVENOUS | Status: AC
  Filled 2015-09-21: qty 20

## 2015-09-21 MED ORDER — ONDANSETRON HCL 4 MG/2ML IJ SOLN
INTRAMUSCULAR | Status: DC | PRN
  Administered 2015-09-21: 4 mg via INTRAVENOUS

## 2015-09-21 MED ORDER — NEOSTIGMINE METHYLSULFATE 10 MG/10ML IV SOLN
INTRAVENOUS | Status: DC | PRN
  Administered 2015-09-21: 4 mg via INTRAVENOUS

## 2015-09-21 MED ORDER — GLYCOPYRROLATE 0.2 MG/ML IJ SOLN
INTRAMUSCULAR | Status: DC | PRN
  Administered 2015-09-21: 0.6 mg via INTRAVENOUS

## 2015-09-21 SURGICAL SUPPLY — 42 items
BARRIER ADHS 3X4 INTERCEED (GAUZE/BANDAGES/DRESSINGS) ×2 IMPLANT
BRR ADH 4X3 ABS CNTRL BYND (GAUZE/BANDAGES/DRESSINGS) ×4
CANISTER SUCT 3000ML (MISCELLANEOUS) ×3 IMPLANT
CELLS DAT CNTRL 66122 CELL SVR (MISCELLANEOUS) IMPLANT
CLOTH BEACON ORANGE TIMEOUT ST (SAFETY) ×3 IMPLANT
CONT PATH 16OZ SNAP LID 3702 (MISCELLANEOUS) ×3 IMPLANT
DECANTER SPIKE VIAL GLASS SM (MISCELLANEOUS) ×3 IMPLANT
DRAPE CESAREAN BIRTH W POUCH (DRAPES) ×1 IMPLANT
DRSG OPSITE POSTOP 4X10 (GAUZE/BANDAGES/DRESSINGS) ×1 IMPLANT
GAUZE SPONGE 4X4 16PLY XRAY LF (GAUZE/BANDAGES/DRESSINGS) ×6 IMPLANT
GLOVE BIO SURGEON STRL SZ8 (GLOVE) ×3 IMPLANT
GLOVE BIOGEL PI IND STRL 7.0 (GLOVE) ×2 IMPLANT
GLOVE BIOGEL PI INDICATOR 7.0 (GLOVE) ×1
GLOVE SURG ORTHO 8.0 STRL STRW (GLOVE) ×3 IMPLANT
GOWN STRL REUS W/TWL LRG LVL3 (GOWN DISPOSABLE) ×6 IMPLANT
NEEDLE HYPO 22GX1.5 SAFETY (NEEDLE) ×3 IMPLANT
NS IRRIG 1000ML POUR BTL (IV SOLUTION) ×3 IMPLANT
PACK ABDOMINAL GYN (CUSTOM PROCEDURE TRAY) ×3 IMPLANT
PAD OB MATERNITY 4.3X12.25 (PERSONAL CARE ITEMS) ×3 IMPLANT
PENCIL SMOKE EVAC W/HOLSTER (ELECTROSURGICAL) ×3 IMPLANT
RETRACTOR WND ALEXIS 18 MED (MISCELLANEOUS) IMPLANT
RETRACTOR WND ALEXIS 25 LRG (MISCELLANEOUS) IMPLANT
RTRCTR WOUND ALEXIS 18CM MED (MISCELLANEOUS)
RTRCTR WOUND ALEXIS 25CM LRG (MISCELLANEOUS)
SPONGE LAP 18X18 X RAY DECT (DISPOSABLE) ×4 IMPLANT
STAPLER VISISTAT 35W (STAPLE) ×3 IMPLANT
SUT MNCRL 0 MO-4 VIOLET 18 CR (SUTURE) IMPLANT
SUT MNCRL 0 VIOLET 6X18 (SUTURE) IMPLANT
SUT MNCRL 0 VIOLET CTX 36 (SUTURE) IMPLANT
SUT MON AB 2-0 CT1 27 (SUTURE) IMPLANT
SUT MON AB-0 CT1 36 (SUTURE) ×3 IMPLANT
SUT MONOCRYL 0 6X18 (SUTURE) ×1
SUT MONOCRYL 0 CTX 36 (SUTURE) ×8
SUT MONOCRYL 0 MO 4 18  CR/8 (SUTURE) ×2
SUT PDS AB 0 CT 36 (SUTURE) IMPLANT
SUT PDS AB 3-0 SH 27 (SUTURE) ×1 IMPLANT
SUT VIC AB 0 CT1 27 (SUTURE) ×6
SUT VIC AB 0 CT1 27XBRD ANBCTR (SUTURE) ×4 IMPLANT
SYR CONTROL 10ML LL (SYRINGE) ×3 IMPLANT
TOWEL OR 17X24 6PK STRL BLUE (TOWEL DISPOSABLE) ×6 IMPLANT
TRAY FOLEY CATH SILVER 14FR (SET/KITS/TRAYS/PACK) ×3 IMPLANT
WATER STERILE IRR 1000ML POUR (IV SOLUTION) ×3 IMPLANT

## 2015-09-21 NOTE — Addendum Note (Signed)
Addendum  created 09/21/15 1422 by Riki Sheer, CRNA   Modules edited: Clinical Notes   Clinical Notes:  File: [id number]

## 2015-09-21 NOTE — Anesthesia Postprocedure Evaluation (Signed)
Anesthesia Post Note  Patient: Christy Rice  Procedure(s) Performed: Procedure(s) (LRB): ABDOMINAL MYOMECTOMY (N/A) Left SALPINGO OOPHORECTOMY  Patient location during evaluation: PACU Anesthesia Type: General Level of consciousness: awake Pain management: pain level controlled Vital Signs Assessment: post-procedure vital signs reviewed and stable Respiratory status: spontaneous breathing Cardiovascular status: stable Postop Assessment: no signs of nausea or vomiting Anesthetic complications: no    Last Vitals:  Filed Vitals:   09/21/15 1100 09/21/15 1200  BP: 103/60 94/53  Pulse: 79 92  Temp: 36.9 C 36.6 C  Resp: 17 18    Last Pain:  Filed Vitals:   09/21/15 1207  PainSc: [place]

## 2015-09-21 NOTE — Anesthesia Procedure Notes (Signed)
Procedure Name: Intubation Date/Time: 09/21/2015 7:39 AM Performed by: Desman Polak, Sheron Nightingale Pre-anesthesia Checklist: Patient identified, Patient being monitored, Emergency Drugs available, Timeout performed and Suction available Patient Re-evaluated:Patient Re-evaluated prior to inductionOxygen Delivery Method: Circle system utilized Preoxygenation: Pre-oxygenation with 100% oxygen Intubation Type: IV induction Ventilation: Mask ventilation without difficulty Laryngoscope Size: Mac and 3 Grade View: Grade I Tube type: Oral Tube size: 7.0 mm Number of attempts: 1 Placement Confirmation: ETT inserted through vocal cords under direct vision,  positive ETCO2 and breath sounds checked- equal and bilateral Secured at: 22 cm Dental Injury: Teeth and Oropharynx as per pre-operative assessment

## 2015-09-21 NOTE — Transfer of Care (Signed)
Immediate Anesthesia Transfer of Care Note  Patient: Christy Rice  Procedure(s) Performed: Procedure(s): ABDOMINAL MYOMECTOMY (N/A) Left SALPINGO OOPHORECTOMY  Patient Location: PACU  Anesthesia Type:General  Level of Consciousness: awake, alert  and oriented  Airway & Oxygen Therapy: Patient Spontanous Breathing and Patient connected to nasal cannula oxygen  Post-op Assessment: Report given to RN and Post -op Vital signs reviewed and stable  Post vital signs: Reviewed and stable  Last Vitals:  Filed Vitals:   09/21/15 0611  BP: 138/76  Pulse: 86  Temp: 36.6 C  Resp: 20    Complications: No apparent anesthesia complications

## 2015-09-21 NOTE — Anesthesia Postprocedure Evaluation (Signed)
Anesthesia Post Note  Patient: Christy Rice  Procedure(s) Performed: Procedure(s) (LRB): ABDOMINAL MYOMECTOMY (N/A) Left SALPINGO OOPHORECTOMY  Patient location during evaluation: [facility] Anesthesia Type: General Level of consciousness: awake and alert Pain management: pain level controlled Vital Signs Assessment: post-procedure vital signs reviewed and stable Respiratory status: spontaneous breathing, nonlabored ventilation, respiratory function stable and patient connected to nasal cannula oxygen Cardiovascular status: blood pressure returned to baseline and stable Postop Assessment: no signs of nausea or vomiting Anesthetic complications: no    Last Vitals:  Filed Vitals:   09/21/15 1100 09/21/15 1200  BP: 103/60 94/53  Pulse: 79 92  Temp: 36.9 C 36.6 C  Resp: 17 18    Last Pain:  Filed Vitals:   09/21/15 1207  PainSc: 4                  Willaim Mode

## 2015-09-21 NOTE — Brief Op Note (Signed)
09/21/2015  9:13 AM  PATIENT:  Christy Rice  45 y.o. female  PRE-OPERATIVE DIAGNOSIS:  RAPIDLY ENLARGING FIBROIDS, AUB, PELVIC PAIN  POST-OPERATIVE DIAGNOSIS:  RAPIDLY ENLARGING FIBROIDS, AUB, PELVIC PAIN, Adhesions PROCEDURE:  Procedure(s): ABDOMINAL MYOMECTOMY (N/A)  SURGEON:  Surgeon(s) and Role:    * Louretta Shorten, MD - Primary    * Arvella Nigh, MD - Assisting  PHYSICIAN ASSISTANT:   ASSISTANTS: [place]   ANESTHESIA:   general  EBL:  Total I/O In: 2000 [I.V.:2000] Out: 1600 [Urine:600; Blood:1000]  BLOOD ADMINISTERED:none  DRAINS: Urinary Catheter (Foley)   LOCAL MEDICATIONS USED:  NONE  SPECIMEN:  Source of Specimen:  fibroids, left tube and ovary  DISPOSITION OF SPECIMEN:  PATHOLOGY  COUNTS:  YES  TOURNIQUET:  * No tourniquets in log *  DICTATION: .Other Dictation: Dictation Number 1  PLAN OF CARE: Admit to inpatient   PATIENT DISPOSITION:  PACU - hemodynamically stable.   Delay start of Pharmacological VTE agent (>24hrs) due to surgical blood loss or risk of bleeding: not applicable

## 2015-09-22 ENCOUNTER — Encounter (HOSPITAL_COMMUNITY): Payer: Self-pay | Admitting: Obstetrics and Gynecology

## 2015-09-22 LAB — CBC
HCT: 19.8 % — ABNORMAL LOW (ref 36.0–46.0)
HEMOGLOBIN: 6.5 g/dL — AB (ref 12.0–15.0)
MCH: 27.1 pg (ref 26.0–34.0)
MCHC: 32.8 g/dL (ref 30.0–36.0)
MCV: 82.5 fL (ref 78.0–100.0)
PLATELETS: 204 10*3/uL (ref 150–400)
RBC: 2.4 MIL/uL — ABNORMAL LOW (ref 3.87–5.11)
RDW: 15.7 % — AB (ref 11.5–15.5)
WBC: 7.5 10*3/uL (ref 4.0–10.5)

## 2015-09-22 MED ORDER — SODIUM CHLORIDE 0.9 % IV SOLN
510.0000 mg | Freq: Once | INTRAVENOUS | Status: AC
  Administered 2015-09-22: 510 mg via INTRAVENOUS
  Filled 2015-09-22: qty 17

## 2015-09-22 NOTE — Op Note (Signed)
Christy Rice, Christy Rice              ACCOUNT NO.:  [id number]  MEDICAL RECORD NO.:  [id number]  LOCATION:  9310                          FACILITY:  [facility]  PHYSICIAN:  Monia Sabal. Corinna Capra, M.D.    DATE OF BIRTH:  [birth year]  DATE OF PROCEDURE:  09/21/2015 DATE OF DISCHARGE:                              OPERATIVE REPORT   PREOPERATIVE DIAGNOSIS:  Enlarging fibroids 20 weeks in size, symptomatic with menorrhagia, menometrorrhagia, abnormal bleeding, and pelvic pressure.  She desires preservation of the uterus.  POSTOPERATIVE DIAGNOSIS:  Enlarging fibroids 20 weeks in size, symptomatic with menorrhagia, menometrorrhagia, abnormal bleeding, and pelvic pressure.  She desires preservation of the uterus plus pelvic adhesions.  PROCEDURE:  Laparotomy with abdominal myomectomy and left salpingo- oophorectomy and lysis of adhesions.  SURGEON:  Monia Sabal. Corinna Capra, MD  ASSISTANT:  Darlyn Chamber, MD  ANESTHESIA:  General endotracheal.  INDICATIONS:  Christy Rice is a 45 year old, black female with enlarging uterine fibroids at 20 weeks in size.  She has had an abnormal bleeding, anemia, pelvic pain, pressure.  She desires surgical intervention, but desires preservation of the uterus.  She has had a previous myomectomy and wants to proceed with repeat myomectomy.  Risks and benefits were discussed at length and informed consent was obtained.  FINDINGS AT THE TIME OF SURGERY:  Multiple fibroids, pelvic adhesions from the bowel and omentum to posterior wall of the uterus including the left tube and ovarian complex which was densely adhered both to the back portion of the uterus, but also to omentum.  DESCRIPTION OF PROCEDURE:  After adequate analgesia, the patient was placed in the supine position.  She was sterilely prepped and draped. Bladder was sterilely drained with Foley catheter.  Pfannenstiel skin incision was made 2 fingerbreadths above the pubic symphysis, taken down sharply.  Fascia was  incised transversely, extended superiorly and inferiorly off the bellies of rectus muscle.  Peritoneum was entered sharply.  The uterus was palpated.  Adhesions were brought into the incisional area and dissected free using Metzenbaum scissors and Bovie cautery with care taken to avoid injury to bowel or bladder, but freeing the uterus subsequently elevated into the incision.  Multiple fibroids were noted.  Careful dissection and Bovie cautery was carried out over the majority of the large fibroids, was then dissected free.  The fibroids below these were also dissected free using Bovie cautery and Metzenbaum scissors.  The incisions were closed with figure-of-eight 0 Monocryl suture, both in the myometrium and also in the serosal layer. This was carried out multiple times with leiomyomas ranging from marble- sized to 6 cm in size until almost all fibroids were removed.  Good hemostasis had been achieved.  We definitely entered into the endometrial cavity to try to close the endometrial cavity and preserve the integrity of the cavity as well as support the myometrium as well as the serosa.  The left tubo-ovarian complex had some continued bleeding after dissection due to the dense adhesions.  It was clear that it had become devascularized and it was going to continue bleeding, so a Haney clamp was placed across the infundibulopelvic ligament.  It was clamped and cut and tied with 0 Monocryl  suture with 2 sutures of 0 Monocryl suture with good approximation noted.  Heaney clamp was placed across the tubo-ovarian complex, the uteroovarian ligament.  The left tube and ovary were removed and the base was suture ligated with 2 sutures of 0 Monocryl suture.  After careful systematic evaluation of the uterus, good hemostasis had been achieved.  The uterus had gone from 20 weeks size down to approximately 10 week size and no fibroids noted at this time.  Interceed adhesion barrier was placed on the  front and the back of the uterus.  The uterus was subsequently placed back into the abdominal cavity.  Care taken to make sure that Interceed stayed in place.  No evidence of abnormal bleeding was noted.  No obvious injury to bowel or bladder noted.  Peritoneum was then closed with 0 Monocryl suture in a running fashion.  Rectus muscle plicated in the midline. The fascia was then closed with 2 sutures of 0 Vicryl in a running fashion with good approximation and good support noted.  Skin was then closed with a subcuticular stitch suture of 4-0 Monocryl with good approximated hemostasis noted.  The patient was stable and transferred to recovery room.  Sponge and instrument count was normal x3.  ESTIMATED BLOOD LOSS:  1000 mL.  Patient received cefotetan preoperatively.     Monia Sabal Corinna Capra, M.D.     DCL/MEDQ  D:  09/21/2015  T:  09/22/2015  Job:  [id number]

## 2015-09-22 NOTE — Addendum Note (Signed)
Addendum  created 09/22/15 1205 by Talbot Grumbling, CRNA   Modules edited: Charges VN

## 2015-09-22 NOTE — Progress Notes (Signed)
1 Day Post-Op Procedure(s) (LRB): ABDOMINAL MYOMECTOMY (N/A) Left SALPINGO OOPHORECTOMY  Subjective: Patient reports incisional pain and no problems voiding.  Some weakness with standing.  History of severe constipation with Feso4  Objective: I have reviewed patient's vital signs, intake and output and labs.  General: alert, cooperative, appears stated age and no distress GI: soft, non-tender; bowel sounds normal; no masses,  no organomegaly and incision: clean, dry and intact Vaginal Bleeding: minimal  Assessment: s/p Procedure(s): ABDOMINAL MYOMECTOMY (N/A) Left SALPINGO OOPHORECTOMY: stable, ileus present, anemia and will discuss IV Iron  Plan: Advance diet Encourage ambulation IV Iron  Recheck CBC in am  LOS: 1 day    Christy Rice C 09/22/2015, 8:28 AM

## 2015-09-23 LAB — CBC
HCT: 15.1 % — ABNORMAL LOW (ref 36.0–46.0)
HCT: 17.4 % — ABNORMAL LOW (ref 36.0–46.0)
Hemoglobin: 5 g/dL — CL (ref 12.0–15.0)
Hemoglobin: 5.7 g/dL — CL (ref 12.0–15.0)
MCH: 27.3 pg (ref 26.0–34.0)
MCH: 27.4 pg (ref 26.0–34.0)
MCHC: 33.1 g/dL (ref 30.0–36.0)
MCHC: 32.8 g/dL (ref 30.0–36.0)
MCV: 82.5 fL (ref 78.0–100.0)
MCV: 83.7 fL (ref 78.0–100.0)
PLATELETS: 132 10*3/uL — AB (ref 150–400)
PLATELETS: 164 10*3/uL (ref 150–400)
RBC: 1.83 MIL/uL — ABNORMAL LOW (ref 3.87–5.11)
RBC: 2.08 MIL/uL — ABNORMAL LOW (ref 3.87–5.11)
RDW: 15.8 % — AB (ref 11.5–15.5)
RDW: 15.9 % — AB (ref 11.5–15.5)
WBC: 6.1 10*3/uL (ref 4.0–10.5)
WBC: 7.3 10*3/uL (ref 4.0–10.5)

## 2015-09-23 LAB — PREPARE RBC (CROSSMATCH)

## 2015-09-23 MED ORDER — ACETAMINOPHEN 325 MG PO TABS
650.0000 mg | ORAL_TABLET | Freq: Once | ORAL | Status: AC
  Administered 2015-09-23: 650 mg via ORAL
  Filled 2015-09-23: qty 2

## 2015-09-23 MED ORDER — BISACODYL 10 MG RE SUPP
10.0000 mg | Freq: Once | RECTAL | Status: AC
  Administered 2015-09-23: 10 mg via RECTAL
  Filled 2015-09-23: qty 1

## 2015-09-23 MED ORDER — SODIUM CHLORIDE 0.9 % IV SOLN
Freq: Once | INTRAVENOUS | Status: AC
  Administered 2015-09-23: 09:00:00 via INTRAVENOUS

## 2015-09-23 MED ORDER — DIPHENHYDRAMINE HCL 25 MG PO CAPS
25.0000 mg | ORAL_CAPSULE | Freq: Once | ORAL | Status: AC
  Administered 2015-09-23: 25 mg via ORAL
  Filled 2015-09-23: qty 1

## 2015-09-23 NOTE — Progress Notes (Signed)
2 Days Post-Op Procedure(s) (LRB): ABDOMINAL MYOMECTOMY (N/A) Left SALPINGO OOPHORECTOMY  Subjective: Patient reports tolerating PO and no problems voiding. Some weakness with standing.  Minimal vag bleeding.  No flatus but tolerates po  Objective: I have reviewed patient's vital signs, intake and output, medications and labs.  General: alert, cooperative, appears stated age and no distress GI: soft, non-tender; bowel sounds normal; no masses,  no organomegaly and incision: clean, dry and intact Vaginal Bleeding: minimal  Assessment: s/p Procedure(s): ABDOMINAL MYOMECTOMY (N/A) Left SALPINGO OOPHORECTOMY: tolerating diet and anemia  Plan: Encourage ambulation Despite Fe transfusion, significant drop in Hgb today  I have recomemended 2 U PRBCs due to acute blood loss, and despite being relatively asymptomatic, because of the decrease since yesterday, Pt agrees with plan of care.  R&B discussed and informed consent obtained.  LOS: 2 days    Christy Rice 09/23/2015, 8:14 AM

## 2015-09-23 NOTE — Progress Notes (Signed)
CRITICAL VALUE ALERT  Critical value received:  Hemoglobin 5.7  Date of notification:  09/23/2015  Time of notification: 1236  Critical value read back:Yes.    Nurse who received alert:  Janalyn Shy, RN    MD notified (1st page):  Corinna Capra  Time of first page: 1238  MD notified (2nd page):  Time of second page:  Responding MD:  Corinna Capra  Time MD responded:  1248

## 2015-09-23 NOTE — Progress Notes (Signed)
Patient aware of critical hemoglobin of 5.7. Patient requesting to have CBC rechecked in am and hold off on blood transfusion until then. Dr. Corinna Capra aware of patient's request. Will continue to monitor.

## 2015-09-23 NOTE — Progress Notes (Signed)
Patient ID: Christy Rice, female   DOB: 1971-05-08, 45 y.o.   MRN: [id number] Korinne decided she would like to wait on blood transfusion because she feels well.  We rechecked CBC and Hgb was 5.7 Her vitals are stable and bleeding minimal. Will recheck CBC in am, if further decease or change in clincial sxs she agrees to proceed with transfusion DL

## 2015-09-23 NOTE — Progress Notes (Signed)
CRITICAL VALUE ALERT  Critical value received:  HGB  Date of notification: 09/23/2015  Time of notification:  0604  Critical value read back:YES  Nurse who received alert:  Sharee Pimple  MD notified (1st page): Helane Rima  Time of first page: 0625  MD notified   Time of second page:  Responding MD:  Time MD responded:  No new orders obtained.

## 2015-09-23 NOTE — Progress Notes (Signed)
RN in to get pre blood vitals and verify arm band for blood release slip... Patient stated she was hesitant and wanted to talk to Dr. Corinna Capra before starting blood transfusion. Stated she, "wondered if there were any other methods they could try first." Dr. Corinna Capra updated and stated he would be by to talk with patient. Patient updated of plan of care.

## 2015-09-23 NOTE — Progress Notes (Signed)
Per Dr. Corinna Capra, hold blood transfusion until 1200 CBC results are back.

## 2015-09-24 LAB — CBC
HCT: 15.7 % — ABNORMAL LOW (ref 36.0–46.0)
HEMATOCRIT: 24 % — AB (ref 36.0–46.0)
HEMOGLOBIN: 5.1 g/dL — AB (ref 12.0–15.0)
HEMOGLOBIN: 7.9 g/dL — AB (ref 12.0–15.0)
MCH: 27.3 pg (ref 26.0–34.0)
MCH: 28.4 pg (ref 26.0–34.0)
MCHC: 32.5 g/dL (ref 30.0–36.0)
MCHC: 32.9 g/dL (ref 30.0–36.0)
MCV: 84 fL (ref 78.0–100.0)
MCV: 86.3 fL (ref 78.0–100.0)
PLATELETS: 152 10*3/uL (ref 150–400)
Platelets: 172 10*3/uL (ref 150–400)
RBC: 1.87 MIL/uL — AB (ref 3.87–5.11)
RBC: 2.78 MIL/uL — ABNORMAL LOW (ref 3.87–5.11)
RDW: 16.1 % — ABNORMAL HIGH (ref 11.5–15.5)
RDW: 15.5 % (ref 11.5–15.5)
WBC: 8.3 10*3/uL (ref 4.0–10.5)
WBC: 8.8 10*3/uL (ref 4.0–10.5)

## 2015-09-24 MED ORDER — DIPHENHYDRAMINE HCL 25 MG PO CAPS
25.0000 mg | ORAL_CAPSULE | Freq: Once | ORAL | Status: AC
  Administered 2015-09-24: 25 mg via ORAL
  Filled 2015-09-24: qty 1

## 2015-09-24 MED ORDER — ACETAMINOPHEN 325 MG PO TABS
650.0000 mg | ORAL_TABLET | Freq: Once | ORAL | Status: AC
  Administered 2015-09-24: 650 mg via ORAL
  Filled 2015-09-24: qty 2

## 2015-09-24 MED ORDER — SODIUM CHLORIDE 0.9 % IV SOLN
Freq: Once | INTRAVENOUS | Status: AC
  Administered 2015-09-24: 07:00:00 via INTRAVENOUS

## 2015-09-24 NOTE — Progress Notes (Addendum)
CRITICAL VALUE ALERT  Critical value received: Hb 5.1  Date of notification:  09/24/15  Time of notification:  [id number]  Critical value read back:Yes.    Nurse who received alert:  Jacquelynn Cree  MD notified (1st page):  N/A  Time of first paged    N/A  MD notified (2nd page):  Time of second page:  Responding MD: Dr. Corinna Capra  Time MD responded:  N/A

## 2015-09-24 NOTE — Progress Notes (Signed)
3 Days Post-Op Procedure(s) (LRB): ABDOMINAL MYOMECTOMY (N/A) Left SALPINGO OOPHORECTOMY  Subjective: Patient reports tolerating PO and no problems voiding.  Palpitations and fatigue with walking.  Now agrees with Transfusion recomendation  Objective: I have reviewed patient's vital signs, intake and output, medications and labs.  General: alert, cooperative, appears stated age and no distress GI: soft, non-tender; bowel sounds normal; no masses,  no organomegaly and incision: clean, dry and intact Extremities: extremities normal, atraumatic, no cyanosis or edema Vaginal Bleeding: none  Assessment: s/p Procedure(s): ABDOMINAL MYOMECTOMY (N/A) Left SALPINGO OOPHORECTOMY: anemia  Plan: Symptomatic Acute blood loss anemia  Transfuse 2 U PRBCs No obvious blood loss.  Ileus improving.  No flatus yet but active BS  LOS: 3 days    Cathyrn Deas C 09/24/2015, 10:44 AM

## 2015-09-25 LAB — TYPE AND SCREEN
ABO/RH(D): A POS
Antibody Screen: NEGATIVE
Unit division: 0
Unit division: 0

## 2015-09-25 LAB — CBC
HCT: 22.5 % — ABNORMAL LOW (ref 36.0–46.0)
Hemoglobin: 7.5 g/dL — ABNORMAL LOW (ref 12.0–15.0)
MCH: 28.6 pg (ref 26.0–34.0)
MCHC: 33.3 g/dL (ref 30.0–36.0)
MCV: 85.9 fL (ref 78.0–100.0)
PLATELETS: 182 10*3/uL (ref 150–400)
RBC: 2.62 MIL/uL — ABNORMAL LOW (ref 3.87–5.11)
RDW: 15.6 % — AB (ref 11.5–15.5)
WBC: 6.9 10*3/uL (ref 4.0–10.5)

## 2015-09-25 MED ORDER — SODIUM CHLORIDE 0.9 % IV SOLN
510.0000 mg | Freq: Once | INTRAVENOUS | Status: AC
  Administered 2015-09-25: 510 mg via INTRAVENOUS
  Filled 2015-09-25: qty 17

## 2015-09-25 MED ORDER — OXYCODONE-ACETAMINOPHEN 5-325 MG PO TABS: 1.0000 | ORAL_TABLET | ORAL | Status: DC | PRN

## 2015-09-25 NOTE — Discharge Summary (Signed)
Physician Discharge Summary  Patient ID: Christy Rice MRN: [id number] DOB/AGE: 1970/07/07 45 y.o.  Admit date: 09/21/2015 Discharge date: 09/25/2015  Admission Diagnoses:AUB, Menorrhagia, anemia, 20 weeks size uterus  Discharge Diagnoses: Same Active Problems:   S/P myomectomy   Discharged Condition: good  Hospital Course: pt underwent an uncomplicated abdominal myomectomy with 28 fibroids removed.  1000cc EBL.  Her post op course was complicated by slow return of bowel function and symptomatic anemia.  She was given IV iron and transfused 2U PRBCs with significant improvement of sxs.  By post op D 3 was tolerating regular diet well, passing flatus, pain well managed with oral meds, and no longer orthostatic.  She had minimal bleeding postop.  Consults: None  Significant Diagnostic Studies: labs: hgb 7.5 on day of d/c. Previous down to 5.1 before transfusion  Treatments: IV hydration, surgery: as above and transfusion 2 U PRBCs  Discharge Exam: Blood pressure 117/62, pulse 95, temperature 98.9 F (37.2 C), temperature source Oral, resp. rate 16, height 5\' 5"  (1.651 m), weight 187 lb (84.823 kg), SpO2 98 %. General appearance: alert, cooperative, appears stated age and no distress GI: soft, non-tender; bowel sounds normal; no masses,  no organomegaly Incision/Wound:CD&I  Disposition: 01-Home or Self Care  Discharge Instructions    Call MD for:  difficulty breathing, headache or visual disturbances    Complete by:  As directed      Call MD for:  persistant nausea and vomiting    Complete by:  As directed      Call MD for:  redness, tenderness, or signs of infection (pain, swelling, redness, odor or green/yellow discharge around incision site)    Complete by:  As directed      Call MD for:  severe uncontrolled pain    Complete by:  As directed      Call MD for:  temperature >100.4    Complete by:  As directed      Diet general    Complete by:  As directed      Discharge  instructions    Complete by:  As directed   Call for post op appt this week     Driving Restrictions    Complete by:  As directed   No driving for 2 weeks     Increase activity slowly    Complete by:  As directed      Lifting restrictions    Complete by:  As directed   No lifting anything greater than 10 pounds (if you have to ask, don't lift it)     Sexual Activity Restrictions    Complete by:  As directed   Nothing in the vagina for 6 weeks            Medication List    TAKE these medications        Calcium 500 MG Chew  Chew 1 tablet by mouth 3 (three) times a week.     fluticasone 50 MCG/ACT nasal spray  Commonly known as:  FLONASE  Place 2 sprays into both nostrils daily.     multivitamin tablet  Take 1 tablet by mouth daily.     oxyCODONE-acetaminophen 5-325 MG tablet  Commonly known as:  PERCOCET/ROXICET  Take 1-2 tablets by mouth every 4 (four) hours as needed for severe pain (moderate to severe pain (when tolerating fluids)).     Vitamin D2 2000 units Tabs  Take 1 tablet by mouth daily.  Signed: Mylia Pondexter C 09/25/2015, 7:45 AM

## 2015-09-25 NOTE — Progress Notes (Signed)
4 Days Post-Op Procedure(s) (LRB): ABDOMINAL MYOMECTOMY (N/A) Left SALPINGO OOPHORECTOMY  Subjective: Patient reports tolerating PO, + flatus, + BM and no problems voiding.    Objective: I have reviewed patient's vital signs, intake and output, medications, labs and pathology.  General: alert, cooperative, appears stated age and no distress GI: soft, non-tender; bowel sounds normal; no masses,  no organomegaly and incision: clean, dry and intact Vaginal Bleeding: none  Assessment: s/p Procedure(s): ABDOMINAL MYOMECTOMY (N/A) Left SALPINGO OOPHORECTOMY: stable and progressing well  Plan: Discharge home feraheme before d/c  hgb 7.5 today  LOS: 4 days    Amyrah Pinkhasov C 09/25/2015, 7:41 AM

## 2015-10-11 ENCOUNTER — Ambulatory Visit (INDEPENDENT_AMBULATORY_CARE_PROVIDER_SITE_OTHER): Payer: BLUE CROSS/BLUE SHIELD | Admitting: Family Medicine

## 2015-10-11 ENCOUNTER — Encounter: Payer: Self-pay | Admitting: Family Medicine

## 2015-10-11 ENCOUNTER — Ambulatory Visit (INDEPENDENT_AMBULATORY_CARE_PROVIDER_SITE_OTHER): Payer: BLUE CROSS/BLUE SHIELD

## 2015-10-11 VITALS — BP 112/71 | HR 73 | Wt 186.0 lb

## 2015-10-11 DIAGNOSIS — X58XXXD Exposure to other specified factors, subsequent encounter: Secondary | ICD-10-CM

## 2015-10-11 DIAGNOSIS — S92912A Unspecified fracture of left toe(s), initial encounter for closed fracture: Secondary | ICD-10-CM

## 2015-10-11 DIAGNOSIS — S92512D Displaced fracture of proximal phalanx of left lesser toe(s), subsequent encounter for fracture with routine healing: Secondary | ICD-10-CM | POA: Diagnosis not present

## 2015-10-11 NOTE — Progress Notes (Signed)
   Subjective:    I'm seeing this patient as a consultation for:  Dr Assunta Found  CC: Left fifth toe fracture  HPI: Patient fell on March 22 injuring her left foot. She was seen in urgent care on the 23rd where she was diagnosed with a fracture of the proximal middle phalanx of the left fifth toe. In the interim she's used a postoperative shoe. She denies significant pain. Most the time she is walking and regular footwear. 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  Past medical history, Surgical history, Family history not pertinant except as noted below, Social history, Allergies, and medications have been entered into the medical record, reviewed, and no changes needed.   Review of Systems: No headache, visual changes, nausea, vomiting, diarrhea, constipation, dizziness, abdominal pain, skin rash, fevers, chills, night sweats, weight loss, swollen lymph nodes, body aches, joint swelling, muscle aches, chest pain, shortness of breath, mood changes, visual or auditory hallucinations.   Objective:    Filed Vitals:   10/11/15 0954  BP: 112/71  Pulse: 73   General: Well Developed, well nourished, and in no acute distress.  Neuro/Psych: Alert and oriented x3, extra-ocular muscles intact, able to move all 4 extremities, sensation grossly intact. Skin: Warm and dry, no rashes noted.  Respiratory: Not using accessory muscles, speaking in full sentences, trachea midline.  Cardiovascular: Pulses palpable, no extremity edema. Abdomen: Does not appear distended. MSK: Left foot is normal-appearing. Minimally tender at the MTP. Normal toe motion. Capillary refill and sensation are intact.  No results found for this or any previous visit (from the past 24 hour(s)). Dg Foot Complete Left  10/11/2015  CLINICAL DATA:  Left fifth toe fracture. EXAM: LEFT FOOT - COMPLETE 3+ VIEW COMPARISON:  September 16, 2015. FINDINGS: The fracture involving the proximal portion of the fifth middle phalanx  appears to have healed significantly as no fracture line remains. Mild callus formation is seen involving the fracture involving the proximal base of the fifth proximal phalanx suggesting healing fracture, although fracture line remains. No other abnormality is seen in the left foot. No soft tissue abnormality is noted. IMPRESSION: Healing fractures of the fifth proximal middle phalanges. Electronically Signed   By: Marijo Conception, M.D.   On: 10/11/2015 10:19    Impression and Recommendations:   This case required medical decision making of moderate complexity.

## 2015-10-11 NOTE — Patient Instructions (Addendum)
Thank you for coming in today. Return in 4 weeks.   Buddy Taping You have a minor finger or toe injury. It can be managed by buddy taping. Buddy taping means the injured finger or toe is taped to a healthy uninjured adjacent finger or toe. Most minor fractures and dislocations of the smaller fingers and toes will heal in 3 to 4 weeks. Buddy taping immobilizes and protects the area of injury. Buddy taping is not recommended for initial treatment of fractures of the thumb, longer fingers, or the great toe. Buddy taping should not be used for unstable or deformed fractures, but as fracture healing progresses it may be used for protection during rehabilitation. Fractured fingers and toes should be protected by buddy taping as long as the injury is still painful or swollen.  When an injury is buddy taped, place a small piece of gauze or cotton between the digits that are taped. This helps prevent the skin from breaking down from increased moisture. Buddy taping allows you to get your injury wet when you bathe. Change the gauze and tape more often if it gets wet, and dry the space between the finger or toes. Use a sturdy, hard-soled shoe for better support if you have a fractured toe. In 2 to 3 weeks you can start motion exercises. This will keep the fingers or toes from becoming stiff.  SEEK IMMEDIATE MEDICAL CARE IF:   The injured area becomes cold, numb, or pale.  You have pain not controlled with medications.  You notice increasing deformity of the toe or finger.   This information is not intended to replace advice given to you by your health care provider. Make sure you discuss any questions you have with your health care provider.   Document Released: 07/20/2004 Document Revised: 07/03/2014 Document Reviewed: 11/04/2014 Elsevier Interactive Patient Education [employer].

## 2015-10-11 NOTE — Assessment & Plan Note (Signed)
Fifth toe. Transition to buddy taping and regular footwear. Return in 4 weeks.

## 2015-10-12 NOTE — Progress Notes (Signed)
Quick Note:  Fractures are healing like we thought. ______

## 2016-05-11 IMAGING — CR DG FOOT COMPLETE 3+V*L*
3 series · 3 of 3 positions shown · non-contrast
Comparison: None.

CLINICAL DATA: Fell on stairs last night with pain 4th to 5th
digits

EXAM:
LEFT FOOT - COMPLETE 3+ VIEW

[foot ap]
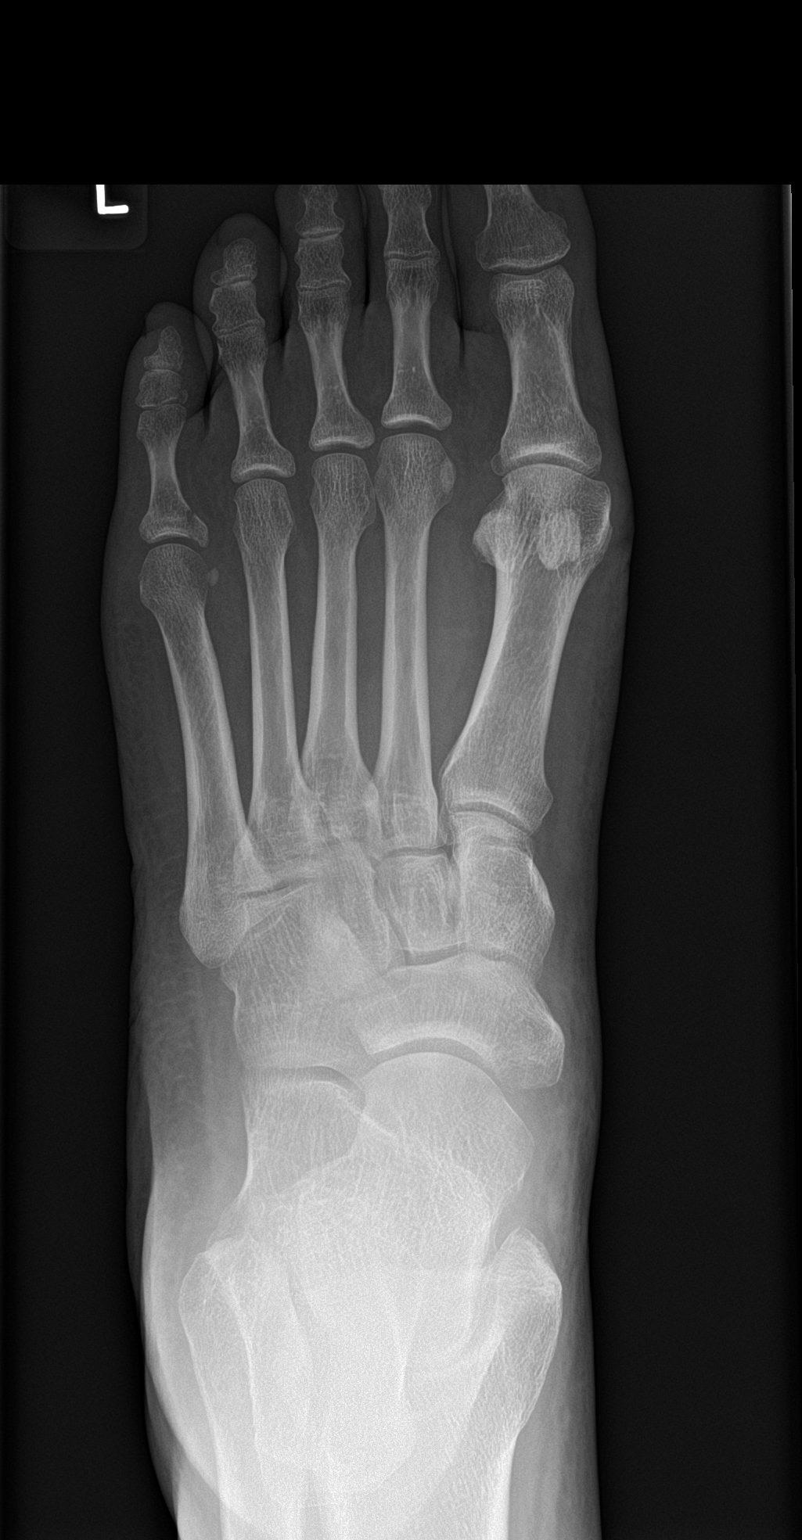

[foot obl]
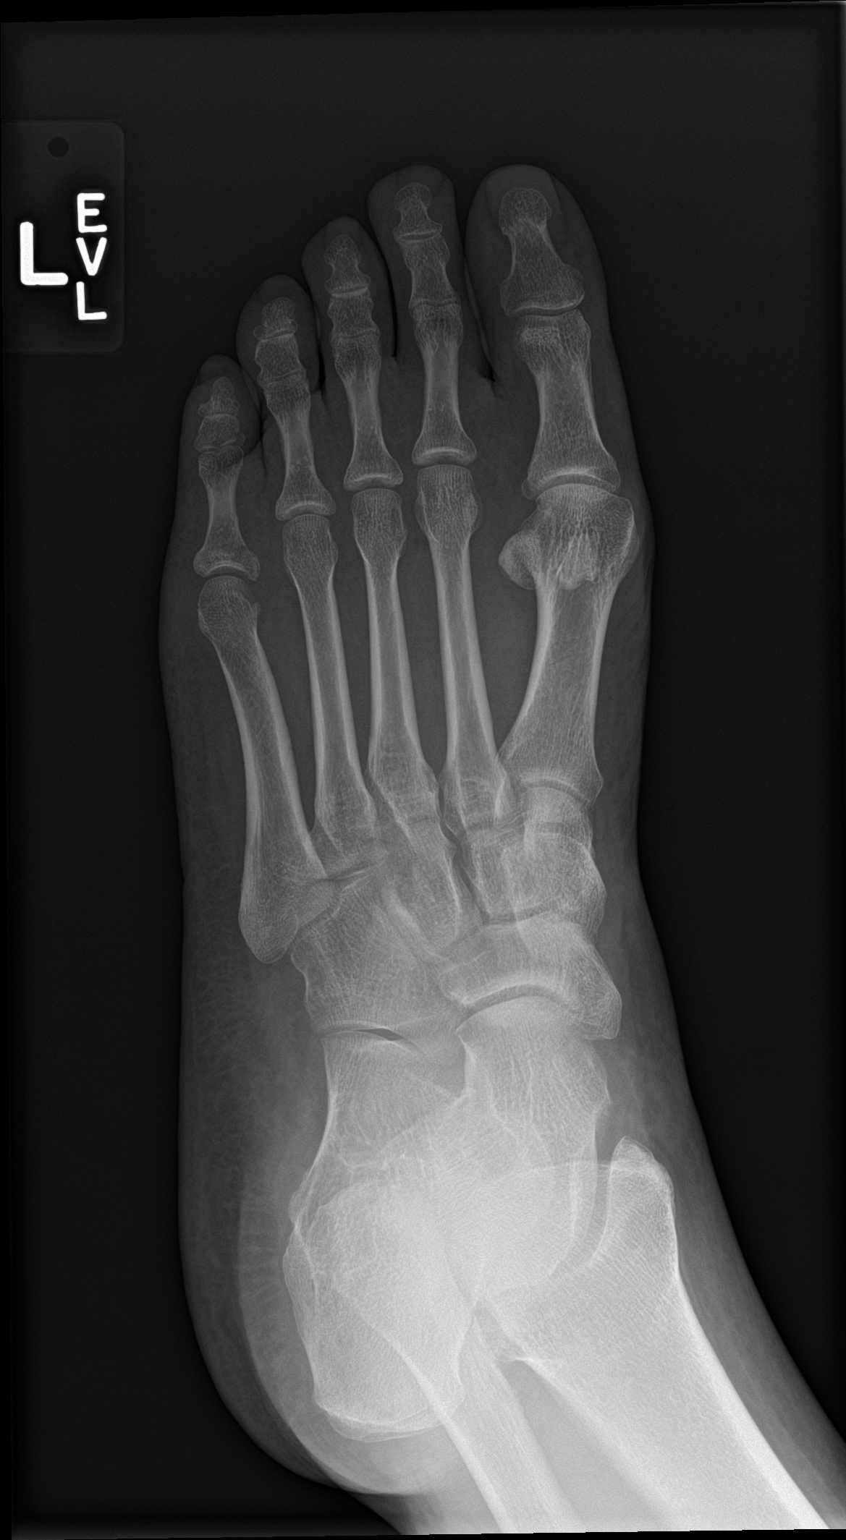

[foot lat]
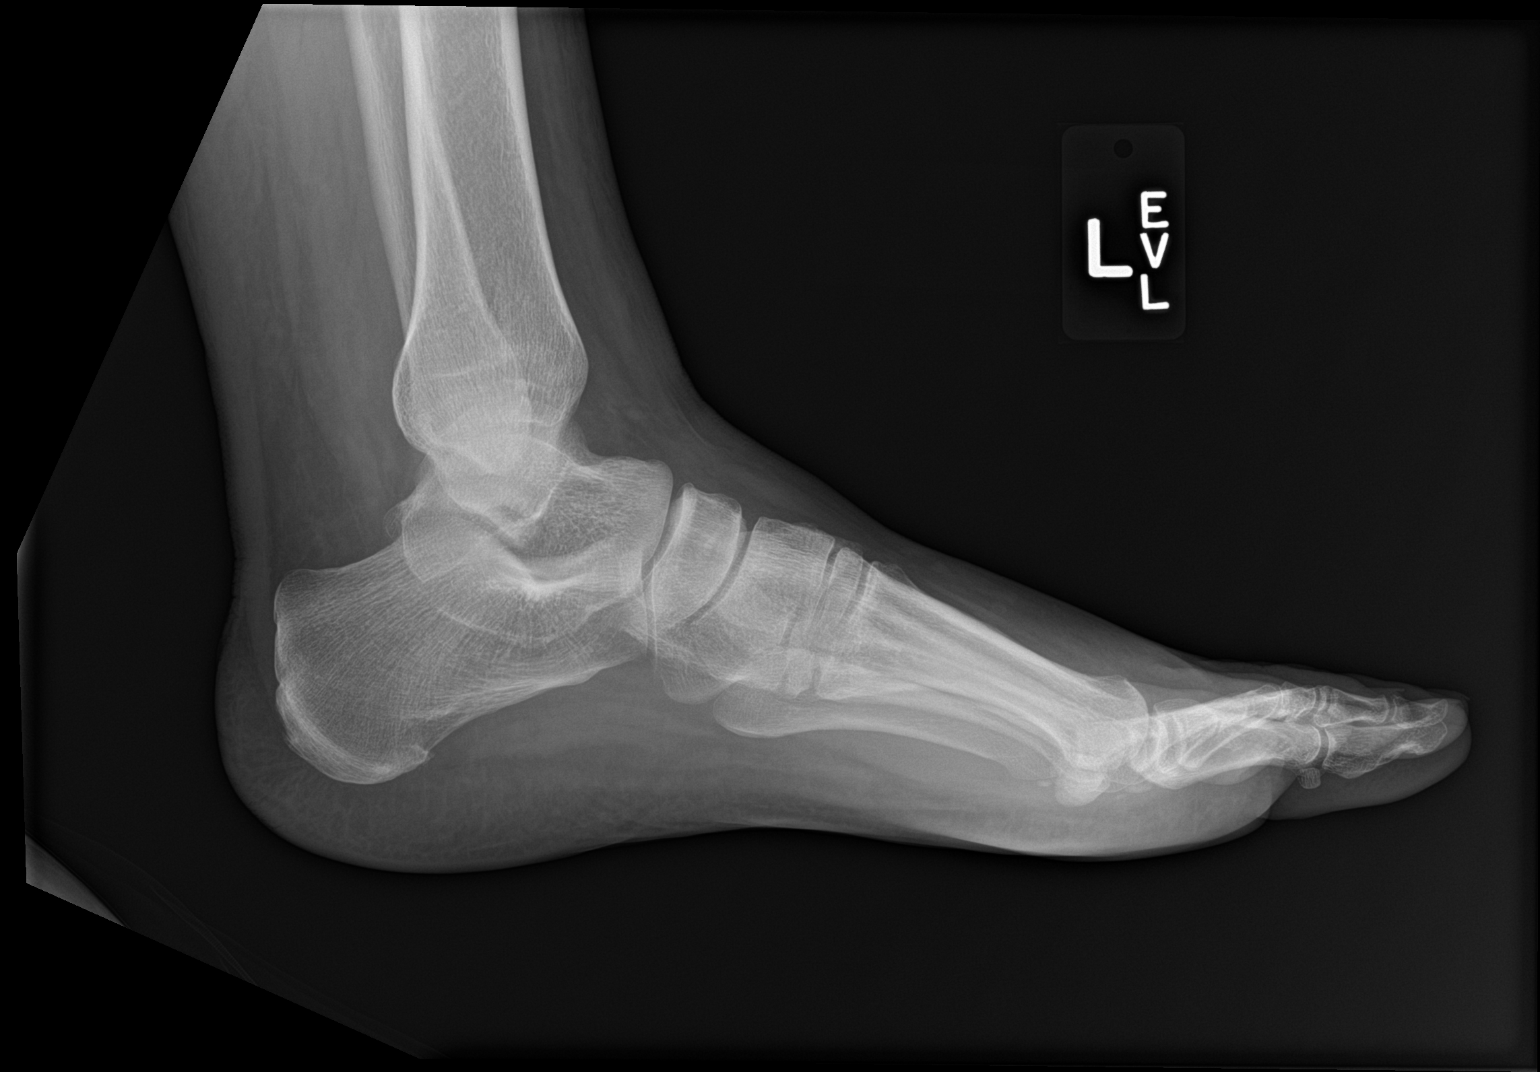

[3 of 3 positions shown; findings below may reference images not displayed]

FINDINGS: There is an insignificantly displaced fracture involving the medial
base of the fifth proximal phalanx. Fracture line extends into the
metatarsophalangeal joint. There is also an insignificantly
displaced fracture involving the medial base of the medial phalanx
with fracture extending into the proximal interphalangeal joint.
IMPRESSION: Fractures of the fifth proximal and middle phalanges.

## 2016-05-12 ENCOUNTER — Other Ambulatory Visit: Payer: Self-pay | Admitting: Obstetrics and Gynecology

## 2016-05-12 DIAGNOSIS — R928 Other abnormal and inconclusive findings on diagnostic imaging of breast: Secondary | ICD-10-CM

## 2016-05-22 ENCOUNTER — Other Ambulatory Visit: Payer: Self-pay | Admitting: Obstetrics and Gynecology

## 2016-05-22 ENCOUNTER — Ambulatory Visit
Admission: RE | Admit: 2016-05-22 | Discharge: 2016-05-22 | Disposition: A | Discharge status: Home / Self Care | Payer: 59 | Source: Ambulatory Visit | Attending: Obstetrics and Gynecology | Admitting: Obstetrics and Gynecology

## 2016-05-22 ENCOUNTER — Other Ambulatory Visit: Payer: BLUE CROSS/BLUE SHIELD

## 2016-05-22 DIAGNOSIS — R928 Other abnormal and inconclusive findings on diagnostic imaging of breast: Secondary | ICD-10-CM

## 2016-05-22 DIAGNOSIS — IMO0002 Reserved for concepts with insufficient information to code with codable children: Secondary | ICD-10-CM

## 2016-05-22 DIAGNOSIS — R229 Localized swelling, mass and lump, unspecified: Principal | ICD-10-CM

## 2016-05-24 ENCOUNTER — Other Ambulatory Visit (INDEPENDENT_AMBULATORY_CARE_PROVIDER_SITE_OTHER): Payer: Self-pay | Admitting: Otolaryngology

## 2016-05-24 DIAGNOSIS — H93A9 Pulsatile tinnitus, unspecified ear: Secondary | ICD-10-CM

## 2016-06-01 ENCOUNTER — Other Ambulatory Visit: Payer: BLUE CROSS/BLUE SHIELD

## 2016-06-07 ENCOUNTER — Ambulatory Visit
Admission: RE | Admit: 2016-06-07 | Discharge: 2016-06-07 | Disposition: A | Discharge status: Home / Self Care | Payer: 59 | Source: Ambulatory Visit | Attending: Obstetrics and Gynecology | Admitting: Obstetrics and Gynecology

## 2016-06-07 ENCOUNTER — Other Ambulatory Visit: Payer: Self-pay | Admitting: Obstetrics and Gynecology

## 2016-06-07 ENCOUNTER — Other Ambulatory Visit: Payer: 59

## 2016-06-07 DIAGNOSIS — R229 Localized swelling, mass and lump, unspecified: Principal | ICD-10-CM

## 2016-06-07 DIAGNOSIS — IMO0002 Reserved for concepts with insufficient information to code with codable children: Secondary | ICD-10-CM

## 2016-06-08 ENCOUNTER — Inpatient Hospital Stay: Admission: RE | Admit: 2016-06-08 | Payer: 59 | Source: Ambulatory Visit

## 2016-06-08 ENCOUNTER — Other Ambulatory Visit: Payer: 59

## 2016-06-09 ENCOUNTER — Other Ambulatory Visit: Payer: 59

## 2016-06-30 ENCOUNTER — Ambulatory Visit
Admission: RE | Admit: 2016-06-30 | Discharge: 2016-06-30 | Disposition: A | Discharge status: Home / Self Care | Payer: 59 | Source: Ambulatory Visit | Attending: Otolaryngology | Admitting: Otolaryngology

## 2016-06-30 DIAGNOSIS — H93A9 Pulsatile tinnitus, unspecified ear: Secondary | ICD-10-CM

## 2016-06-30 MED ORDER — GADOBENATE DIMEGLUMINE 529 MG/ML IV SOLN
18.0000 mL | Freq: Once | INTRAVENOUS | Status: AC | PRN
  Administered 2016-06-30: 18 mL via INTRAVENOUS

## 2016-07-04 ENCOUNTER — Other Ambulatory Visit (INDEPENDENT_AMBULATORY_CARE_PROVIDER_SITE_OTHER): Payer: Self-pay | Admitting: Otolaryngology

## 2016-07-07 ENCOUNTER — Ambulatory Visit
Admission: RE | Admit: 2016-07-07 | Discharge: 2016-07-07 | Disposition: A | Discharge status: Home / Self Care | Payer: Managed Care, Other (non HMO) | Source: Ambulatory Visit | Attending: Otolaryngology | Admitting: Otolaryngology

## 2016-07-07 DIAGNOSIS — H93A9 Pulsatile tinnitus, unspecified ear: Secondary | ICD-10-CM

## 2016-09-11 ENCOUNTER — Ambulatory Visit (INDEPENDENT_AMBULATORY_CARE_PROVIDER_SITE_OTHER): Payer: Managed Care, Other (non HMO) | Admitting: Otolaryngology

## 2016-09-11 ENCOUNTER — Other Ambulatory Visit: Payer: Self-pay | Admitting: Obstetrics and Gynecology

## 2016-09-11 DIAGNOSIS — H6983 Other specified disorders of Eustachian tube, bilateral: Secondary | ICD-10-CM | POA: Diagnosis not present

## 2016-09-11 DIAGNOSIS — H9313 Tinnitus, bilateral: Secondary | ICD-10-CM

## 2016-09-11 DIAGNOSIS — N63 Unspecified lump in unspecified breast: Secondary | ICD-10-CM

## 2016-10-27 ENCOUNTER — Ambulatory Visit
Admission: RE | Admit: 2016-10-27 | Discharge: 2016-10-27 | Disposition: A | Discharge status: Home / Self Care | Payer: Managed Care, Other (non HMO) | Source: Ambulatory Visit | Attending: Obstetrics and Gynecology | Admitting: Obstetrics and Gynecology

## 2016-10-27 ENCOUNTER — Other Ambulatory Visit: Payer: Self-pay | Admitting: Obstetrics and Gynecology

## 2016-10-27 DIAGNOSIS — N63 Unspecified lump in unspecified breast: Secondary | ICD-10-CM

## 2017-05-28 ENCOUNTER — Telehealth: Payer: Self-pay | Admitting: Cardiovascular Disease

## 2017-06-22 IMAGING — MG 2D DIGITAL DIAGNOSTIC UNILATERAL RIGHT MAMMOGRAM WITH CAD AND AD
6 series · 6 of 14 positions shown · non-contrast
Comparison: Previous exam(s).

CLINICAL DATA: Six-month follow-up for an asymmetry within the
outer right breast, not amenable to earlier stereotactic biopsy due
to its position within the breast.

EXAM:
2D DIGITAL DIAGNOSTIC UNILATERAL RIGHT MAMMOGRAM WITH CAD AND
ADJUNCT TOMO

[R CC]
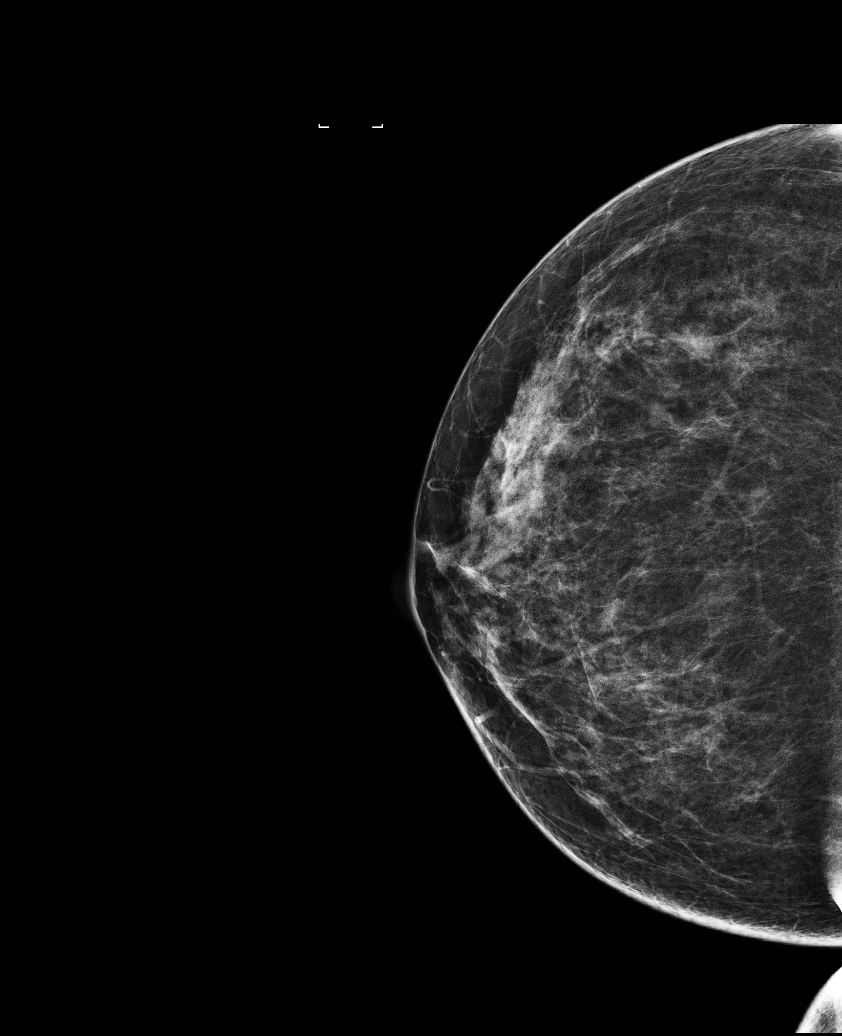

[R MLO synth-2D]
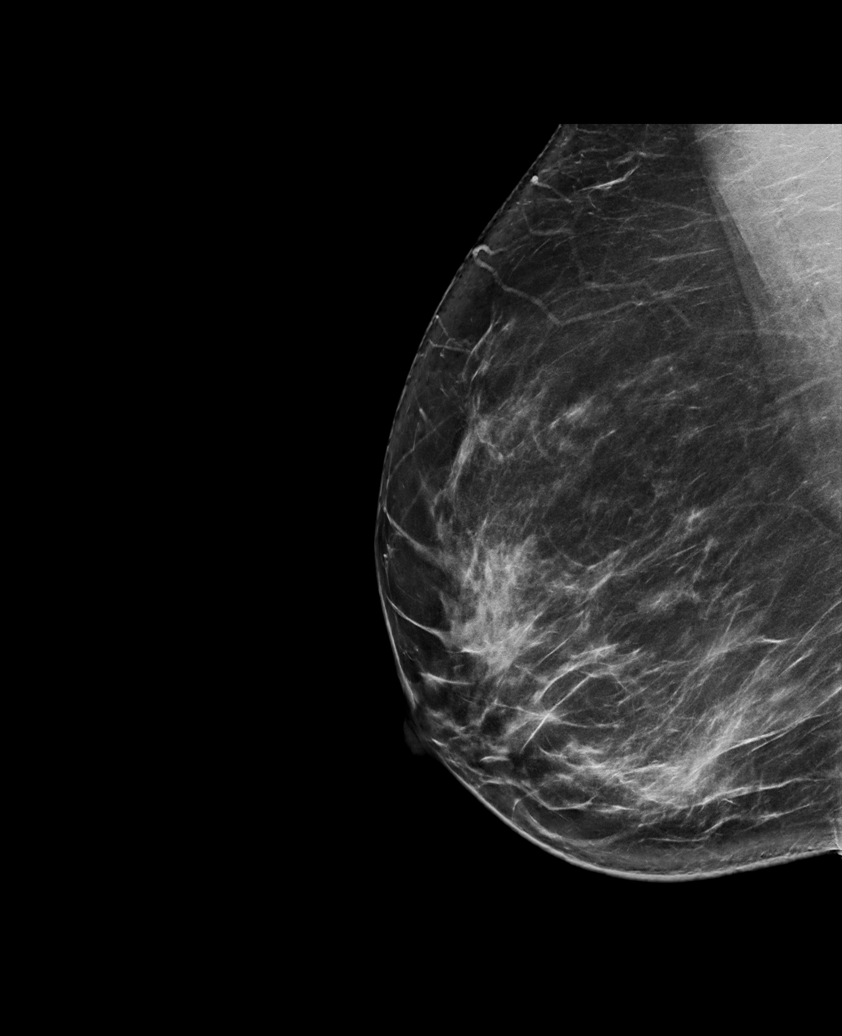

[R CC synth-2D]
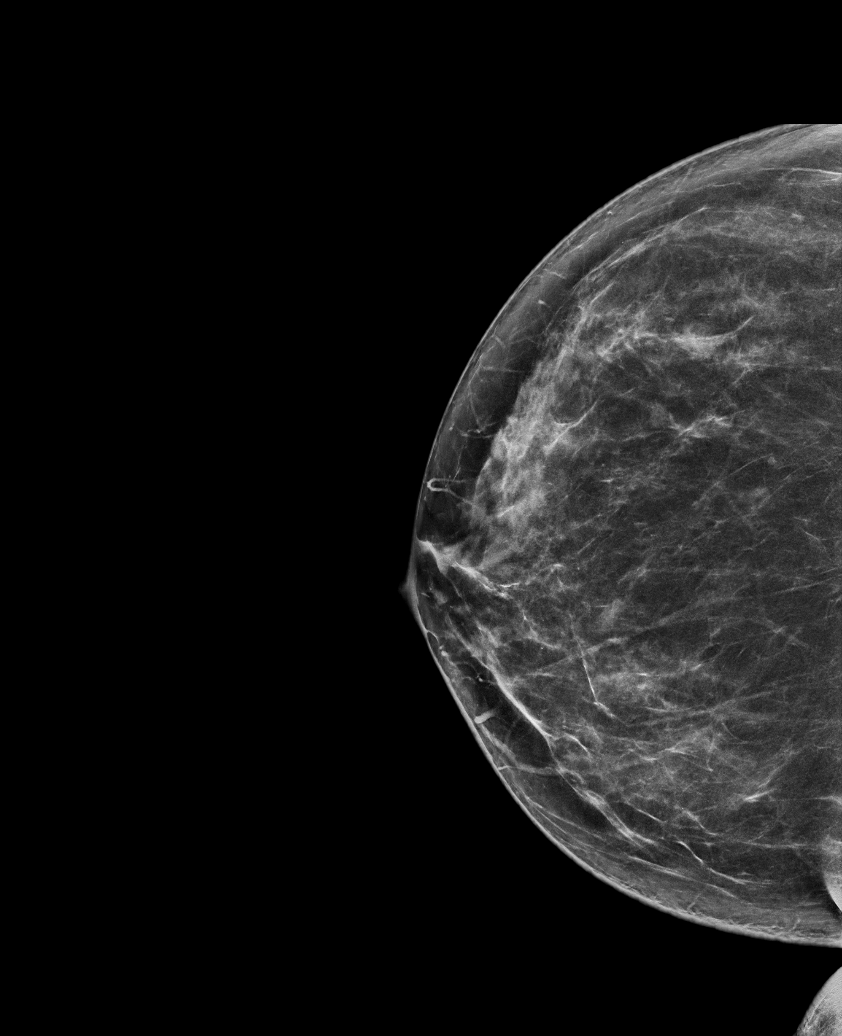

[R MLO]
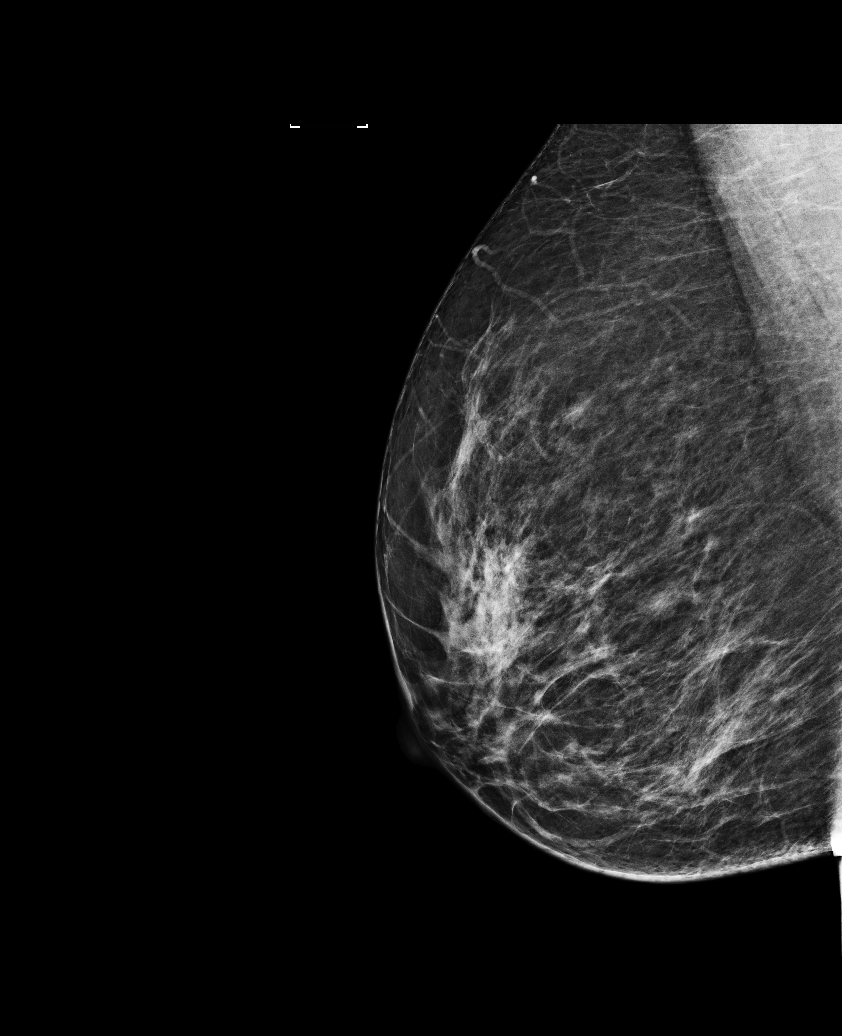

[R MLO tomo · tomo slice 45/90.0]
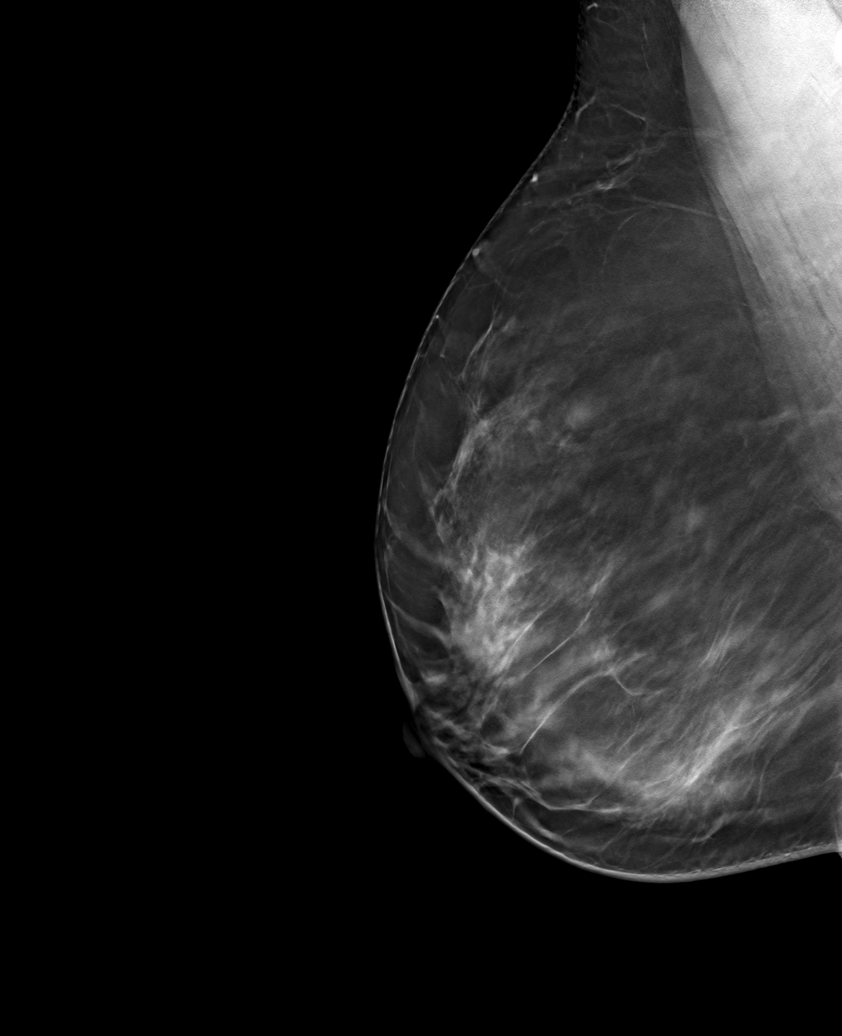

[R CC tomo · tomo slice 41/82.0]
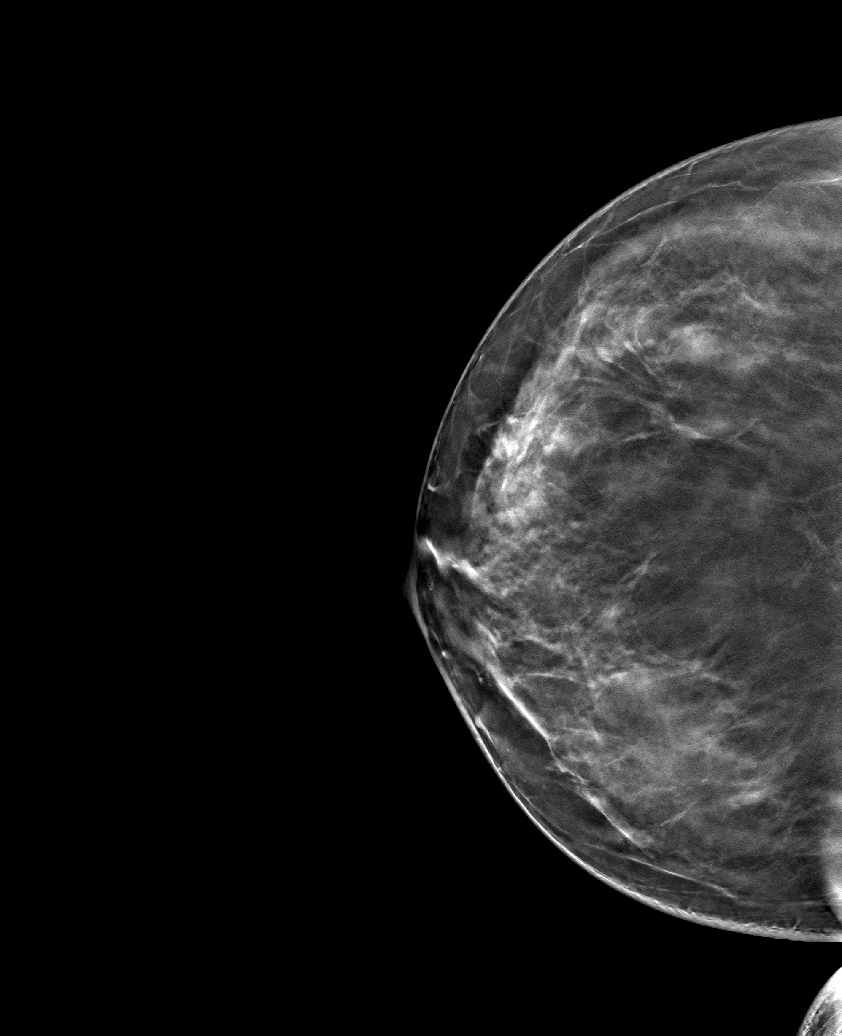

[6 of 14 positions shown; findings below may reference images not displayed]

ACR Breast Density Category b: There are scattered areas of
fibroglandular density.
FINDINGS: Right breast 2D CC and MLO projections were obtained today, with
additional 3D tomosynthesis. There is no persistent asymmetry within
the outer right breast, with the overall fibroglandular pattern now
appearing stable compared to multiple prior studies dating back to
3237 indicating benignity. There are no new dominant masses,
suspicious calcifications or secondary signs of malignancy
identified within the right breast on today's exam.

Mammographic images were processed with CAD.
IMPRESSION: No evidence of malignancy within the right breast.

Patient may return to routine annual bilateral screening mammogram
schedule. Next bilateral screening mammogram will be due in 6 months
corresponding to patient's routine left breast screening mammogram
schedule.

RECOMMENDATION:
Bilateral screening mammogram in 6 months.

I have discussed the findings and recommendations with the patient.
Results were also provided in writing at the conclusion of the
visit. If applicable, a reminder letter will be sent to the patient
regarding the next appointment.

BI-RADS CATEGORY  1: Negative.

## 2017-06-25 NOTE — Telephone Encounter (Signed)
error 

## 2018-08-01 ENCOUNTER — Ambulatory Visit (INDEPENDENT_AMBULATORY_CARE_PROVIDER_SITE_OTHER): Payer: No Typology Code available for payment source | Admitting: Orthopaedic Surgery

## 2018-08-01 ENCOUNTER — Ambulatory Visit (INDEPENDENT_AMBULATORY_CARE_PROVIDER_SITE_OTHER): Payer: No Typology Code available for payment source

## 2018-08-01 ENCOUNTER — Encounter (INDEPENDENT_AMBULATORY_CARE_PROVIDER_SITE_OTHER): Payer: Self-pay | Admitting: Orthopaedic Surgery

## 2018-08-01 VITALS — BP 140/85 | HR 77 | Ht 65.0 in | Wt 172.0 lb

## 2018-08-01 DIAGNOSIS — M542 Cervicalgia: Secondary | ICD-10-CM

## 2018-08-01 DIAGNOSIS — M545 Low back pain, unspecified: Secondary | ICD-10-CM

## 2018-08-01 DIAGNOSIS — M5020 Other cervical disc displacement, unspecified cervical region: Secondary | ICD-10-CM | POA: Diagnosis not present

## 2018-08-01 NOTE — Addendum Note (Signed)
Addended by: Meyer Cory on: 08/01/2018 10:57 AM   Modules accepted: Orders
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
## 2018-08-22 ENCOUNTER — Ambulatory Visit (INDEPENDENT_AMBULATORY_CARE_PROVIDER_SITE_OTHER): Payer: No Typology Code available for payment source | Admitting: Orthopaedic Surgery

## 2018-08-22 ENCOUNTER — Telehealth (INDEPENDENT_AMBULATORY_CARE_PROVIDER_SITE_OTHER): Payer: Self-pay

## 2018-08-22 ENCOUNTER — Encounter (INDEPENDENT_AMBULATORY_CARE_PROVIDER_SITE_OTHER): Payer: Self-pay | Admitting: Orthopaedic Surgery

## 2018-08-22 VITALS — BP 117/71 | HR 76 | Ht 65.0 in | Wt 172.0 lb

## 2018-08-22 DIAGNOSIS — M5020 Other cervical disc displacement, unspecified cervical region: Secondary | ICD-10-CM | POA: Diagnosis not present

## 2018-08-22 NOTE — Telephone Encounter (Signed)
-----   Message from Marybelle Killings, MD sent at 08/22/2018 10:21 AM EST ----- Cc wc thx
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
## 2018-08-22 NOTE — Telephone Encounter (Signed)
Faxed office note and work note to work comp [place] @ [facility]

## 2018-09-19 ENCOUNTER — Other Ambulatory Visit: Payer: Self-pay

## 2018-09-19 ENCOUNTER — Ambulatory Visit (INDEPENDENT_AMBULATORY_CARE_PROVIDER_SITE_OTHER): Payer: No Typology Code available for payment source | Admitting: Orthopaedic Surgery

## 2018-09-19 ENCOUNTER — Encounter (INDEPENDENT_AMBULATORY_CARE_PROVIDER_SITE_OTHER): Payer: Self-pay | Admitting: Orthopaedic Surgery

## 2018-09-19 VITALS — Ht 65.0 in | Wt 174.0 lb

## 2018-09-19 DIAGNOSIS — M5020 Other cervical disc displacement, unspecified cervical region: Secondary | ICD-10-CM
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
## 2018-09-23 ENCOUNTER — Telehealth (INDEPENDENT_AMBULATORY_CARE_PROVIDER_SITE_OTHER): Payer: Self-pay

## 2018-09-23 NOTE — Telephone Encounter (Signed)
Faxed 09/19/18 work note and office note to wc adj per her request

## 2018-10-18 ENCOUNTER — Telehealth (INDEPENDENT_AMBULATORY_CARE_PROVIDER_SITE_OTHER): Payer: Self-pay | Admitting: Radiology

## 2018-10-18 MED ORDER — DICLOFENAC SODIUM 1 % TD GEL: TRANSDERMAL | 2 refills | Status: DC

## 2018-10-18 NOTE — Telephone Encounter (Signed)
Patient left voicemail requesting Rx for Voltaren Gel be sent in to the [facility] in [place].  She states that this seems to help her pain.  Please advise.  OK for Rx?  CB for patient is [phone]

## 2018-10-18 NOTE — Telephone Encounter (Signed)
OK - thanks

## 2018-10-18 NOTE — Telephone Encounter (Signed)
I called patient and advised. 

## 2018-10-18 NOTE — Telephone Encounter (Signed)
Sent to pharmacy 

## 2018-11-21 ENCOUNTER — Other Ambulatory Visit: Payer: Self-pay

## 2018-11-21 ENCOUNTER — Encounter: Payer: Self-pay | Admitting: Orthopaedic Surgery

## 2018-11-21 ENCOUNTER — Ambulatory Visit (INDEPENDENT_AMBULATORY_CARE_PROVIDER_SITE_OTHER): Payer: No Typology Code available for payment source | Admitting: Orthopaedic Surgery

## 2018-11-21 VITALS — Ht 65.0 in | Wt 180.0 lb

## 2018-11-21 DIAGNOSIS — M5020 Other cervical disc displacement, unspecified cervical region: Secondary | ICD-10-CM | POA: Diagnosis not present
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
## 2018-12-05 ENCOUNTER — Telehealth: Payer: Self-pay | Admitting: Radiology

## 2018-12-05 NOTE — Telephone Encounter (Signed)
Fix her a note keeping her out until she gets her referral for surgery and her surgeon can then supply her with a note. Thanks   She will get fax # for you to send note.thank you.  I called her and discussed. She travels and is having trouble doing her work.

## 2018-12-05 NOTE — Telephone Encounter (Signed)
Patient would like a call from Dr. Lorin Mercy regarding neck and mid back pain. She is concerned that no one looked at her thoracic spine and that she is having pain there. She is having difficulty working and this is affecting her job. She states that she has not gone back out on the road, but is doing virtual visits from home and the pain is increasing due to her using her computer for 45 minutes per visit.    The referral was denied by Dr. Jenne Campus due to him not accepting workman's comp patients. She states that her WC carrier has reached out to other physicians and is waiting to hear from [facility].  The only facility that they have found to take her at this point is in [place], [place] so they are still working to find her something closer.  Patient call back is [phone]

## 2018-12-05 NOTE — Telephone Encounter (Signed)
Note entered into system. 

## 2018-12-06 NOTE — Telephone Encounter (Signed)
Patient sent note from My Chart to employer. Would also like faxed to [phone] attn Eduard Roux.

## 2018-12-06 NOTE — Telephone Encounter (Signed)
Note faxed per patient request. Patient also asked for note to be mailed to her home. Note mailed.

## 2022-03-03 ENCOUNTER — Telehealth: Payer: Self-pay | Admitting: Hematology

## 2022-03-03 NOTE — Telephone Encounter (Signed)
Scheduled appt per 9/8 referral. Pt is aware of appt date and time. Pt is aware to arrive 15 mins prior to appt time and to bring and updated insurance card. Pt is aware of appt location.   

## 2022-03-28 ENCOUNTER — Inpatient Hospital Stay: Payer: 59

## 2022-03-28 ENCOUNTER — Inpatient Hospital Stay: Payer: 59 | Attending: Hematology | Admitting: Hematology

## 2022-03-28 VITALS — BP 142/79 | HR 72 | Temp 97.3°F | Resp 15 | Wt 165.9 lb

## 2022-03-28 DIAGNOSIS — Z803 Family history of malignant neoplasm of breast: Secondary | ICD-10-CM | POA: Diagnosis not present

## 2022-03-28 DIAGNOSIS — D72819 Decreased white blood cell count, unspecified: Secondary | ICD-10-CM

## 2022-03-28 DIAGNOSIS — D709 Neutropenia, unspecified: Secondary | ICD-10-CM

## 2022-03-28 LAB — CBC WITH DIFFERENTIAL (CANCER CENTER ONLY)
Abs Immature Granulocytes: 0.01 10*3/uL (ref 0.00–0.07)
Basophils Absolute: 0 10*3/uL (ref 0.0–0.1)
Basophils Relative: 1 %
Eosinophils Absolute: 0.1 10*3/uL (ref 0.0–0.5)
Eosinophils Relative: 2 %
HCT: 38.4 % (ref 36.0–46.0)
Hemoglobin: 12.8 g/dL (ref 12.0–15.0)
Immature Granulocytes: 0 %
Lymphocytes Relative: 40 %
Lymphs Abs: 1.2 10*3/uL (ref 0.7–4.0)
MCH: 30.4 pg (ref 26.0–34.0)
MCHC: 33.3 g/dL (ref 30.0–36.0)
MCV: 91.2 fL (ref 80.0–100.0)
Monocytes Absolute: 0.3 10*3/uL (ref 0.1–1.0)
Monocytes Relative: 8 %
Neutro Abs: 1.4 10*3/uL — ABNORMAL LOW (ref 1.7–7.7)
Neutrophils Relative %: 49 %
Platelet Count: 175 10*3/uL (ref 150–400)
RBC: 4.21 MIL/uL (ref 3.87–5.11)
RDW: 12.7 % (ref 11.5–15.5)
WBC Count: 3 10*3/uL — ABNORMAL LOW (ref 4.0–10.5)
nRBC: 0 % (ref 0.0–0.2)

## 2022-03-28 LAB — CMP (CANCER CENTER ONLY)
ALT: 17 U/L (ref 0–44)
AST: 15 U/L (ref 15–41)
Albumin: 4.4 g/dL (ref 3.5–5.0)
Alkaline Phosphatase: 61 U/L (ref 38–126)
Anion gap: 3 — ABNORMAL LOW (ref 5–15)
BUN: 7 mg/dL (ref 6–20)
CO2: 32 mmol/L (ref 22–32)
Calcium: 9.1 mg/dL (ref 8.9–10.3)
Chloride: 106 mmol/L (ref 98–111)
Creatinine: 0.92 mg/dL (ref 0.44–1.00)
GFR, Estimated: >60 mL/min (ref >60)
Glucose, Bld: 86 mg/dL (ref 70–99)
Potassium: 3.8 mmol/L (ref 3.5–5.1)
Sodium: 141 mmol/L (ref 135–145)
Total Bilirubin: 1.4 mg/dL — ABNORMAL HIGH (ref 0.3–1.2)
Total Protein: 7 g/dL (ref 6.5–8.1)

## 2022-03-28 LAB — LACTATE DEHYDROGENASE: LDH: 113 U/L (ref 98–192)

## 2022-03-28 LAB — HIV ANTIBODY (ROUTINE TESTING W REFLEX): HIV Screen 4th Generation wRfx: NONREACTIVE

## 2022-03-28 LAB — HEPATITIS C ANTIBODY: HCV Ab: NONREACTIVE

## 2022-03-28 LAB — VITAMIN B12: Vitamin B-12: 222 pg/mL (ref 180–914)

## 2022-03-28 NOTE — Progress Notes (Signed)
HEMATOLOGY/ONCOLOGY CONSULTATION NOTE  Date of Service: 03/28/2022  Patient Care Team: Helane Rima, MD as PCP - General (Family Medicine)  CHIEF COMPLAINTS/PURPOSE OF CONSULTATION:  Evaluation and consultation for recent labs which showed leukopenia.  HISTORY OF PRESENTING ILLNESS:  Christy Rice is a wonderful 51 y.o. female who has been referred to [place] by Dr Helane Rima, MD for evaluation and consultation for recent labs which showed leukopenia. She reports She is doing well.  She notes her WBC count has previously been around 4k. Today it is 2.4k. She notes some minor fatigue recently but otherwise says she feels fine. No nausea or vomiting.   She changed her diet to vegetarian for the last 30 days and previously ate meat.  No recent OTC pain medications/NSAIDS.  We discussed getting labs today for further evaluation which she is agreeable to.  We discussed treatment options and possible etiologies and will continue to evaluate further.  No history of smoking.  No new or recurrent infection issues. No other new or acute focal symptoms.  Labs done 02/20/2022 were reviewed in detail.  MEDICAL HISTORY:  Past Medical History:  Diagnosis Date   Anemia    Thyroiditis, subacute     SURGICAL HISTORY: Past Surgical History:  Procedure Laterality Date   MYOMECTOMY  2009   MYOMECTOMY N/A 09/21/2015   Procedure: ABDOMINAL MYOMECTOMY;  Surgeon: Louretta Shorten, MD;  Location: [facility];  Service: Gynecology;  Laterality: N/A;   SALPINGOOPHORECTOMY  09/21/2015   Procedure: Left SALPINGO OOPHORECTOMY;  Surgeon: Louretta Shorten, MD;  Location: [facility];  Service: Gynecology;;   WISDOM TOOTH EXTRACTION  1992    SOCIAL HISTORY: Social History   Socioeconomic History   Marital status: Single    Spouse name: Not on file   Number of children: Not on file   Years of education: Not on file   Highest education level: Not on file  Occupational History   Not on file  Tobacco Use    Smoking status: Never   Smokeless tobacco: Never  Substance and Sexual Activity   Alcohol use: No   Drug use: No   Sexual activity: Yes    Birth control/protection: None  Other Topics Concern   Not on file  Social History Narrative   Not on file   Social Determinants of Health   Financial Resource Strain: Not on file  Food Insecurity: Not on file  Transportation Needs: Not on file  Physical Activity: Not on file  Stress: Not on file  Social Connections: Not on file  Intimate Partner Violence: Not on file    FAMILY HISTORY: No family history on file. Fhx: Maternal grandmother breast cancer diagnosed at age 52, died at 83. Paternal lupus, sarcoidosis.  ALLERGIES:  is allergic to sulfa antibiotics.  MEDICATIONS:  Current Outpatient Medications  Medication Sig Dispense Refill   Calcium 500 MG CHEW Chew 1 tablet by mouth 3 (three) times a week.     diclofenac sodium (VOLTAREN) 1 % GEL PLACE ONTO THE SKIN 4 TIMES A DAY AS NEEDED (4 6 GRAMS) 1 Tube 2   Ergocalciferol (VITAMIN D2) 2000 units TABS Take 1 tablet by mouth daily.     fluticasone (FLONASE) 50 MCG/ACT nasal spray Place 2 sprays into both nostrils daily. (Patient not taking: Reported on 08/01/2018) 9.9 g 2   Multiple Vitamin (MULTIVITAMIN) tablet Take 1 tablet by mouth daily.     ondansetron (ZOFRAN) 4 MG tablet TAKE 1 TABLET BY MOUTH EVERY 8 HOURS AS  NEEDED FOR NAUSEA FOR UP TO 7 DAYS     orphenadrine (NORFLEX) 100 MG tablet TAKE 1 TABLET BY MOUTH TWICE DAILY AS NEEDED FOR UP TO 10 DAYS     oxyCODONE-acetaminophen (PERCOCET/ROXICET) 5-325 MG tablet Take 1-2 tablets by mouth every 4 (four) hours as needed for severe pain (moderate to severe pain (when tolerating fluids)). (Patient not taking: Reported on 08/01/2018) 30 tablet 0   predniSONE (STERAPRED UNI-PAK 48 TAB) 10 MG (48) TBPK tablet TAKE BY MOUTH DAILY FOR 12 DAYS AS DIRECTED. YOU MAKE TAKE EACH DAILY DOSE IN FULL UNLESS CAUSES STOMACH UPSET. IF YOU DEVELOP STOMACH UPSET      No current facility-administered medications for this visit.    REVIEW OF SYSTEMS:   10 Point review of Systems was done is negative except as noted above.  PHYSICAL EXAMINATION: ECOG PERFORMANCE STATUS: 1 - Symptomatic but completely ambulatory  . Vitals:   03/28/22 1433  BP: (!) 142/79  Pulse: 72  Resp: 15  Temp: (!) 97.3 F (36.3 C)  SpO2: 100%   Filed Weights   03/28/22 1433  Weight: 165 lb 14.4 oz (75.3 kg)   .Body mass index is 27.61 kg/m.  NAD GENERAL:alert, in no acute distress and comfortable SKIN: no acute rashes, no significant lesions EYES: conjunctiva are pink and non-injected, sclera anicteric NECK: supple, no JVD LYMPH:  no palpable lymphadenopathy in the cervical, axillary or inguinal regions LUNGS: clear to auscultation b/l with normal respiratory effort HEART: regular rate & rhythm ABDOMEN:  normoactive bowel sounds , non tender, not distended. Extremity: no pedal edema PSYCH: alert & oriented x 3 with fluent speech NEURO: no focal motor/sensory deficits  LABORATORY DATA:  I have reviewed the data as listed  .    Latest Ref Rng & Units 09/25/2015    6:02 AM 09/24/2015    2:54 PM 09/24/2015    5:22 AM  CBC  WBC 4.0 - 10.5 K/uL 6.9  8.8  8.3   Hemoglobin 12.0 - 15.0 g/dL 7.5  7.9  5.1   Hematocrit 36.0 - 46.0 % 22.5  24.0  15.7   Platelets 150 - 400 K/uL 182  172  152     .    Latest Ref Rng & Units 05/01/2013    8:23 AM 10/09/2007   11:26 AM  CMP  Glucose 65 - 99 mg/dL 101  98   BUN 6 - 24 mg/dL 9  7   Creatinine 0.57 - 1.00 mg/dL 1.09  0.88   Sodium 134 - 144 mmol/L 143  139   Potassium 3.5 - 5.2 mmol/L 5.1  4.0   Chloride 97 - 108 mmol/L 104  106   CO2 18 - 29 mmol/L 25  26   Calcium 8.7 - 10.2 mg/dL 9.7  9.5   Total Protein 6.0 - 8.5 g/dL 6.7  7.4   Total Bilirubin 0.0 - 1.2 mg/dL 0.7  0.9   Alkaline Phos 39 - 117 IU/L 59  50   AST 0 - 40 IU/L 12  19   ALT 0 - 32 IU/L 8  19    Labs done 02/20/2022:     RADIOGRAPHIC  STUDIES: I have personally reviewed the radiological images as listed and agreed with the findings in the report. No results found.  ASSESSMENT & PLAN:   51 y.o. very pleasant female with  1. Leukopenia -Labs done 02/20/2022 show WBC count of 2.4k. History of WBC count of around 4k.  Plan -I discussed available  labs with the patient in details Labs done 02/20/2022 were reviewed in detail. CMP unremarkable. -Patient's chart reviewed in detail -We will get labs today to evaluate her current status further and for treatment planning.  . Orders Placed This Encounter  Procedures   CBC with Differential ([facility] Only)    Standing Status:   Future    Number of Occurrences:   1    Standing Expiration Date:   03/29/2023   CMP ([facility] only)    Standing Status:   Future    Number of Occurrences:   1    Standing Expiration Date:   03/29/2023   Lactate dehydrogenase    Standing Status:   Future    Number of Occurrences:   1    Standing Expiration Date:   03/29/2023   Hepatitis C antibody    Standing Status:   Future    Number of Occurrences:   1    Standing Expiration Date:   03/29/2023   HIV Antibody (routine testing w rflx)    Standing Status:   Future    Number of Occurrences:   1    Standing Expiration Date:   03/29/2023   Vitamin B12    Standing Status:   Future    Number of Occurrences:   1    Standing Expiration Date:   03/28/2023   Folate RBC    Standing Status:   Future    Number of Occurrences:   1    Standing Expiration Date:   03/29/2023   Multiple Myeloma Panel (SPEP&IFE w/QIG)    Standing Status:   Future    Number of Occurrences:   1    Standing Expiration Date:   03/29/2023   TSH    Standing Status:   Future    Number of Occurrences:   1    Standing Expiration Date:   03/28/2023    Follow up: Labs today Phone visit with Dr Irene Limbo in 1 week  .The total time spent in the appointment was 60 minutes* .  All of the patient's questions were answered  with apparent satisfaction. The patient knows to call the clinic with any problems, questions or concerns.   Sullivan Lone MD MS AAHIVMS [facility] Hematology/Oncology Physician [facility]  .*Total Encounter Time as defined by the Centers for Medicare and Medicaid Services includes, in addition to the face-to-face time of a patient visit (documented in the note above) non-face-to-face time: obtaining and reviewing outside history, ordering and reviewing medications, tests or procedures, care coordination (communications with other health care professionals or caregivers) and documentation in the medical record.     Sullivan Lone MD [place] AAHIVMS [facility] Hematology/Oncology Physician [facility]  (Office):       [phone] (Work cell):  [phone] (Fax):           [phone]   I, Melene Muller, am acting as scribe for Fabienne Bruns, MD.   .I have reviewed the above documentation for accuracy and completeness, and I agree with the above. Brunetta Genera MD

## 2022-03-29 LAB — TSH: TSH: 1.229 u[IU]/mL (ref 0.350–4.500)

## 2022-03-29 LAB — FOLATE RBC
Folate, Hemolysate: 317 ng/mL
Folate, RBC: 805 ng/mL (ref >498)
Hematocrit: 39.4 % (ref 34.0–46.6)

## 2022-04-03 LAB — MULTIPLE MYELOMA PANEL, SERUM
Albumin SerPl Elph-Mcnc: 3.7 g/dL (ref 2.9–4.4)
Albumin/Glob SerPl: 1.3 (ref 0.7–1.7)
Alpha 1: 0.2 g/dL (ref 0.0–0.4)
Alpha2 Glob SerPl Elph-Mcnc: 0.6 g/dL (ref 0.4–1.0)
B-Globulin SerPl Elph-Mcnc: 1 g/dL (ref 0.7–1.3)
Gamma Glob SerPl Elph-Mcnc: 1.2 g/dL (ref 0.4–1.8)
Globulin, Total: 3 g/dL (ref 2.2–3.9)
IgA: 179 mg/dL (ref 87–352)
IgG (Immunoglobin G), Serum: 1273 mg/dL (ref 586–1602)
IgM (Immunoglobulin M), Srm: 218 mg/dL — ABNORMAL HIGH (ref 26–217)
Total Protein ELP: 6.7 g/dL (ref 6.0–8.5)

## 2022-04-05 ENCOUNTER — Inpatient Hospital Stay (HOSPITAL_BASED_OUTPATIENT_CLINIC_OR_DEPARTMENT_OTHER): Payer: 59 | Admitting: Hematology

## 2022-04-05 DIAGNOSIS — D709 Neutropenia, unspecified: Secondary | ICD-10-CM

## 2022-04-05 NOTE — Progress Notes (Addendum)
HEMATOLOGY/ONCOLOGY PHONE VISIT NOTE   Date of Service: 04/05/2022  Patient Care Team: Helane Rima, MD as PCP - General (Family Medicine)  CHIEF COMPLAINTS/PURPOSE OF CONSULTATION:  Evaluation and consultation for recent labs which showed leukopenia.  HISTORY OF PRESENTING ILLNESS:   INTREVAL HISTORY  ..I connected with NYANA HAREN on 04/05/2022 at  8:40 AM EDT by telephone visit and verified that I am speaking with the correct person using two identifiers.   I discussed the limitations, risks, security and privacy concerns of performing an evaluation and management service by telemedicine and the availability of in-person appointments. I also discussed with the patient that there may be a patient responsible charge related to this service. The patient expressed understanding and agreed to proceed.   Other persons participating in the visit and their role in the encounter: none   Patient's location: home  Provider's location: [facility]   Chief Complaint: LAB discussion   Patient was called to discuss lab w/u from her initial consultation for leucopenia. Patient notes no new symptoms since her last clinic visit. Lab results were discussed in details with the patient.  MEDICAL HISTORY:  Past Medical History:  Diagnosis Date   Anemia    Thyroiditis, subacute     SURGICAL HISTORY: Past Surgical History:  Procedure Laterality Date   MYOMECTOMY  2009   MYOMECTOMY N/A 09/21/2015   Procedure: ABDOMINAL MYOMECTOMY;  Surgeon: Louretta Shorten, MD;  Location: [facility];  Service: Gynecology;  Laterality: N/A;   SALPINGOOPHORECTOMY  09/21/2015   Procedure: Left SALPINGO OOPHORECTOMY;  Surgeon: Louretta Shorten, MD;  Location: [facility];  Service: Gynecology;;   WISDOM TOOTH EXTRACTION  1992    SOCIAL HISTORY: Social History   Socioeconomic History   Marital status: Single    Spouse name: Not on file   Number of children: Not on file   Years of education: Not on file   Highest education  level: Not on file  Occupational History   Not on file  Tobacco Use   Smoking status: Never   Smokeless tobacco: Never  Substance and Sexual Activity   Alcohol use: No   Drug use: No   Sexual activity: Yes    Birth control/protection: None  Other Topics Concern   Not on file  Social History Narrative   Not on file   Social Determinants of Health   Financial Resource Strain: Not on file  Food Insecurity: Not on file  Transportation Needs: Not on file  Physical Activity: Not on file  Stress: Not on file  Social Connections: Not on file  Intimate Partner Violence: Not on file    FAMILY HISTORY: No family history on file. Fhx: Maternal grandmother breast cancer diagnosed at age 41, died at 67. Paternal lupus, sarcoidosis.  ALLERGIES:  is allergic to sulfa antibiotics.  MEDICATIONS:  Current Outpatient Medications  Medication Sig Dispense Refill   Calcium 500 MG CHEW Chew 1 tablet by mouth 3 (three) times a week.     diclofenac sodium (VOLTAREN) 1 % GEL PLACE ONTO THE SKIN 4 TIMES A DAY AS NEEDED (4 6 GRAMS) 1 Tube 2   Ergocalciferol (VITAMIN D2) 2000 units TABS Take 1 tablet by mouth daily.     fluticasone (FLONASE) 50 MCG/ACT nasal spray Place 2 sprays into both nostrils daily. (Patient not taking: Reported on 08/01/2018) 9.9 g 2   Multiple Vitamin (MULTIVITAMIN) tablet Take 1 tablet by mouth daily.     ondansetron (ZOFRAN) 4 MG tablet TAKE 1 TABLET  BY MOUTH EVERY 8 HOURS AS NEEDED FOR NAUSEA FOR UP TO 7 DAYS     orphenadrine (NORFLEX) 100 MG tablet TAKE 1 TABLET BY MOUTH TWICE DAILY AS NEEDED FOR UP TO 10 DAYS     oxyCODONE-acetaminophen (PERCOCET/ROXICET) 5-325 MG tablet Take 1-2 tablets by mouth every 4 (four) hours as needed for severe pain (moderate to severe pain (when tolerating fluids)). (Patient not taking: Reported on 08/01/2018) 30 tablet 0   predniSONE (STERAPRED UNI-PAK 48 TAB) 10 MG (48) TBPK tablet TAKE BY MOUTH DAILY FOR 12 DAYS AS DIRECTED. YOU MAKE TAKE EACH  DAILY DOSE IN FULL UNLESS CAUSES STOMACH UPSET. IF YOU DEVELOP STOMACH UPSET     No current facility-administered medications for this visit.    REVIEW OF SYSTEMS:   No fevers/chills/night sweats  PHYSICAL EXAMINATION: Telemedicine visit  LABORATORY DATA:  I have reviewed the data as listed .    Latest Ref Rng & Units 03/28/2022    3:38 PM 09/25/2015    6:02 AM 09/24/2015    2:54 PM  CBC  WBC 4.0 - 10.5 K/uL 3.0  6.9  8.8   Hemoglobin 12.0 - 15.0 g/dL 12.8  7.5  7.9   Hematocrit 34.0 - 46.6 % 36.0 - 46.0 % 39.4    38.4  22.5  24.0   Platelets 150 - 400 K/uL 175  182  172    .ANC 1400     Latest Ref Rng & Units 03/28/2022    3:38 PM 05/01/2013    8:23 AM 10/09/2007   11:26 AM  CMP  Glucose 70 - 99 mg/dL 86  101  98   BUN 6 - 20 mg/dL '7  9  7   '$ Creatinine 0.44 - 1.00 mg/dL 0.92  1.09  0.88   Sodium 135 - 145 mmol/L 141  143  139   Potassium 3.5 - 5.1 mmol/L 3.8  5.1  4.0   Chloride 98 - 111 mmol/L 106  104  106   CO2 22 - 32 mmol/L 32  25  26   Calcium 8.9 - 10.3 mg/dL 9.1  9.7  9.5   Total Protein 6.5 - 8.1 g/dL 7.0  6.7  7.4   Total Bilirubin 0.3 - 1.2 mg/dL 1.4  0.7  0.9   Alkaline Phos 38 - 126 U/L 61  59  50   AST 15 - 41 U/L '15  12  19   '$ ALT 0 - 44 U/L '17  8  19    '$ . Lab Results  Component Value Date   LDH 113 03/28/2022   Hep C neg HIV neg B12 -- low at 222 Myeloma panel-- no M spike TSH WNL   Labs done 02/20/2022:     RADIOGRAPHIC STUDIES: I have personally reviewed the radiological images as listed and agreed with the findings in the report. No results found.  ASSESSMENT & PLAN:   51 y.o. very pleasant female with:  1. Leukopenia- WBC count 3k minimal neutropenia ANC 1400 2. B-12 Deficiency. B12 -222  Plan -Discussed lab result from 03/28/2022, which showed WBC of 3.0k, hemoglobin of 12.8k, and platelet counts of 175k. Her B-12 value was slightly low of 222. -HIV and Hepatitis-C were negative.  -Recommended to take B-complex and B-12  supplement daily. . No orders of the defined types were placed in this encounter.   Follow up: -Phone visit with Dr Irene Limbo in 4 months -labs 1 day prior to phone.  The total time spent in the  appointment was 10 minutes* .  All of the patient's questions were answered with apparent satisfaction. The patient knows to call the clinic with any problems, questions or concerns.    I, Cleda Mccreedy, acting as a Education administrator for Sullivan Lone, MD.,have documented all relevant documentation on the behalf of Sullivan Lone, MD,as directed by  Sullivan Lone, MD while in the presence of Sullivan Lone, MD.   Sullivan Lone MD [place] AAHIVMS [facility] Hematology/Oncology Physician [facility]  .*Total Encounter Time as defined by the Centers for Medicare and Medicaid Services includes, in addition to the face-to-face time of a patient visit (documented in the note above) non-face-to-face time: obtaining and reviewing outside history, ordering and reviewing medications, tests or procedures, care coordination (communications with other health care professionals or caregivers) and documentation in the medical record.

## 2022-05-09 ENCOUNTER — Encounter: Payer: Self-pay | Admitting: Internal Medicine

## 2022-07-06 ENCOUNTER — Encounter: Payer: Managed Care, Other (non HMO) | Admitting: Internal Medicine

## 2022-08-08 ENCOUNTER — Other Ambulatory Visit: Payer: Self-pay

## 2022-08-08 DIAGNOSIS — D709 Neutropenia, unspecified: Secondary | ICD-10-CM

## 2022-08-09 ENCOUNTER — Inpatient Hospital Stay: Payer: 59 | Attending: Hematology

## 2022-08-09 ENCOUNTER — Other Ambulatory Visit: Payer: Self-pay

## 2022-08-09 DIAGNOSIS — D709 Neutropenia, unspecified: Secondary | ICD-10-CM

## 2022-08-09 DIAGNOSIS — D72819 Decreased white blood cell count, unspecified: Secondary | ICD-10-CM | POA: Diagnosis present

## 2022-08-09 LAB — CMP (CANCER CENTER ONLY)
ALT: 8 U/L (ref 0–44)
AST: 16 U/L (ref 15–41)
Albumin: 3.9 g/dL (ref 3.5–5.0)
Alkaline Phosphatase: 59 U/L (ref 38–126)
Anion gap: 7 (ref 5–15)
BUN: 11 mg/dL (ref 6–20)
CO2: 28 mmol/L (ref 22–32)
Calcium: 8.8 mg/dL — ABNORMAL LOW (ref 8.9–10.3)
Chloride: 104 mmol/L (ref 98–111)
Creatinine: 1.04 mg/dL — ABNORMAL HIGH (ref 0.44–1.00)
GFR, Estimated: >60 mL/min (ref >60)
Glucose, Bld: 82 mg/dL (ref 70–99)
Potassium: 4 mmol/L (ref 3.5–5.1)
Sodium: 139 mmol/L (ref 135–145)
Total Bilirubin: 1 mg/dL (ref 0.3–1.2)
Total Protein: 7.2 g/dL (ref 6.5–8.1)

## 2022-08-09 LAB — CBC WITH DIFFERENTIAL (CANCER CENTER ONLY)
Abs Immature Granulocytes: 0.01 10*3/uL (ref 0.00–0.07)
Basophils Absolute: 0 10*3/uL (ref 0.0–0.1)
Basophils Relative: 1 %
Eosinophils Absolute: 0.1 10*3/uL (ref 0.0–0.5)
Eosinophils Relative: 2 %
HCT: 37.7 % (ref 36.0–46.0)
Hemoglobin: 13 g/dL (ref 12.0–15.0)
Immature Granulocytes: 0 %
Lymphocytes Relative: 47 %
Lymphs Abs: 1.6 10*3/uL (ref 0.7–4.0)
MCH: 30.9 pg (ref 26.0–34.0)
MCHC: 34.5 g/dL (ref 30.0–36.0)
MCV: 89.5 fL (ref 80.0–100.0)
Monocytes Absolute: 0.3 10*3/uL (ref 0.1–1.0)
Monocytes Relative: 8 %
Neutro Abs: 1.4 10*3/uL — ABNORMAL LOW (ref 1.7–7.7)
Neutrophils Relative %: 42 %
Platelet Count: 185 10*3/uL (ref 150–400)
RBC: 4.21 MIL/uL (ref 3.87–5.11)
RDW: 12.4 % (ref 11.5–15.5)
WBC Count: 3.3 10*3/uL — ABNORMAL LOW (ref 4.0–10.5)
nRBC: 0 % (ref 0.0–0.2)

## 2022-08-09 LAB — LACTATE DEHYDROGENASE: LDH: 106 U/L (ref 98–192)

## 2022-08-11 ENCOUNTER — Inpatient Hospital Stay (HOSPITAL_BASED_OUTPATIENT_CLINIC_OR_DEPARTMENT_OTHER): Payer: 59 | Admitting: Hematology

## 2022-08-11 DIAGNOSIS — D709 Neutropenia, unspecified: Secondary | ICD-10-CM | POA: Diagnosis not present

## 2022-08-11 NOTE — Progress Notes (Signed)
HEMATOLOGY/ONCOLOGY PHONE VISIT NOTE   Date of Service: 08/11/2022  Patient Care Team: Helane Rima, MD as PCP - General (Family Medicine)  CHIEF COMPLAINTS/PURPOSE OF CONSULTATION:  Follow-up for continued evaluation and management of leukopenia  HISTORY OF PRESENTING ILLNESS:   INTREVAL HISTORY  Patient was last seen by me on 04/05/22 and was doing well overall with no new medical concerns.   I connected with CAELA SHANNON on 08/11/22 at  3:30 PM EST by telephone visit and verified that I am speaking with the correct person using two identifiers.   I discussed the limitations, risks, security and privacy concerns of performing an evaluation and management service by telemedicine and the availability of in-person appointments. I also discussed with the patient that there may be a patient responsible charge related to this service. The patient expressed understanding and agreed to proceed.   Other persons participating in the visit and their role in the encounter: none   Patient's location: home  Provider's location: [facility]   Chief Complaint: Follow-up for continued evaluation and management of leukopenia   Today, she reports doing well overall. She denies any infection issues, fever, chills night sweats, or new bone pain. She compliantly takes her vitamin B-12 and vitamin B complex. She does take OTC vitamin D and occasionally biotin. She also takes Senokot for constipation. She does not currently take any OTC pain medication, though she does note that she previously took ibuprofen during her menstrual cycles.   She previously did not have any seasonal allergies, but she has struggled with this over the past few years. She does not use Voltaren gel on a regular basis. She has not taken any steroids for her allergies over last few years. She last took prednisone in 2017/18. She reports that she exercises heavily at least twice a week.  She reports a Fhx of several autoimmune  diseases, including rheumatoid arthritis in her sister.  MEDICAL HISTORY:  Past Medical History:  Diagnosis Date   Anemia    Thyroiditis, subacute     SURGICAL HISTORY: Past Surgical History:  Procedure Laterality Date   MYOMECTOMY  2009   MYOMECTOMY N/A 09/21/2015   Procedure: ABDOMINAL MYOMECTOMY;  Surgeon: Louretta Shorten, MD;  Location: [facility];  Service: Gynecology;  Laterality: N/A;   SALPINGOOPHORECTOMY  09/21/2015   Procedure: Left SALPINGO OOPHORECTOMY;  Surgeon: Louretta Shorten, MD;  Location: [facility];  Service: Gynecology;;   WISDOM TOOTH EXTRACTION  1992    SOCIAL HISTORY: Social History   Socioeconomic History   Marital status: Single    Spouse name: Not on file   Number of children: Not on file   Years of education: Not on file   Highest education level: Not on file  Occupational History   Not on file  Tobacco Use   Smoking status: Never   Smokeless tobacco: Never  Substance and Sexual Activity   Alcohol use: No   Drug use: No   Sexual activity: Yes    Birth control/protection: None  Other Topics Concern   Not on file  Social History Narrative   Not on file   Social Determinants of Health   Financial Resource Strain: Not on file  Food Insecurity: Not on file  Transportation Needs: Not on file  Physical Activity: Not on file  Stress: Not on file  Social Connections: Not on file  Intimate Partner Violence: Not on file    FAMILY HISTORY: No family history on file. Fhx: Maternal grandmother breast cancer  diagnosed at age 31, died at 85. Paternal lupus, sarcoidosis.  ALLERGIES:  is allergic to sulfa antibiotics.  MEDICATIONS:  Current Outpatient Medications  Medication Sig Dispense Refill   Calcium 500 MG CHEW Chew 1 tablet by mouth 3 (three) times a week.     diclofenac sodium (VOLTAREN) 1 % GEL PLACE ONTO THE SKIN 4 TIMES A DAY AS NEEDED (4 6 GRAMS) 1 Tube 2   Ergocalciferol (VITAMIN D2) 2000 units TABS Take 1 tablet by mouth daily.     fluticasone  (FLONASE) 50 MCG/ACT nasal spray Place 2 sprays into both nostrils daily. (Patient not taking: Reported on 08/01/2018) 9.9 g 2   Multiple Vitamin (MULTIVITAMIN) tablet Take 1 tablet by mouth daily.     ondansetron (ZOFRAN) 4 MG tablet TAKE 1 TABLET BY MOUTH EVERY 8 HOURS AS NEEDED FOR NAUSEA FOR UP TO 7 DAYS     orphenadrine (NORFLEX) 100 MG tablet TAKE 1 TABLET BY MOUTH TWICE DAILY AS NEEDED FOR UP TO 10 DAYS     oxyCODONE-acetaminophen (PERCOCET/ROXICET) 5-325 MG tablet Take 1-2 tablets by mouth every 4 (four) hours as needed for severe pain (moderate to severe pain (when tolerating fluids)). (Patient not taking: Reported on 08/01/2018) 30 tablet 0   predniSONE (STERAPRED UNI-PAK 48 TAB) 10 MG (48) TBPK tablet TAKE BY MOUTH DAILY FOR 12 DAYS AS DIRECTED. YOU MAKE TAKE EACH DAILY DOSE IN FULL UNLESS CAUSES STOMACH UPSET. IF YOU DEVELOP STOMACH UPSET     No current facility-administered medications for this visit.    REVIEW OF SYSTEMS:    10 Point review of Systems was done is negative except as noted above.  PHYSICAL EXAMINATION: Telemedicine visit  LABORATORY DATA:  I have reviewed the data as listed .    Latest Ref Rng & Units 08/09/2022    3:29 PM 03/28/2022    3:38 PM 09/25/2015    6:02 AM  CBC  WBC 4.0 - 10.5 K/uL 3.3  3.0  6.9   Hemoglobin 12.0 - 15.0 g/dL 13.0  12.8  7.5   Hematocrit 36.0 - 46.0 % 37.7  39.4    38.4  22.5   Platelets 150 - 400 K/uL 185  175  182    .ANC 1400     Latest Ref Rng & Units 08/09/2022    3:29 PM 03/28/2022    3:38 PM 05/01/2013    8:23 AM  CMP  Glucose 70 - 99 mg/dL 82  86  101   BUN 6 - 20 mg/dL 11  7  9   $ Creatinine 0.44 - 1.00 mg/dL 1.04  0.92  1.09   Sodium 135 - 145 mmol/L 139  141  143   Potassium 3.5 - 5.1 mmol/L 4.0  3.8  5.1   Chloride 98 - 111 mmol/L 104  106  104   CO2 22 - 32 mmol/L 28  32  25   Calcium 8.9 - 10.3 mg/dL 8.8  9.1  9.7   Total Protein 6.5 - 8.1 g/dL 7.2  7.0  6.7   Total Bilirubin 0.3 - 1.2 mg/dL 1.0  1.4  0.7    Alkaline Phos 38 - 126 U/L 59  61  59   AST 15 - 41 U/L 16  15  12   $ ALT 0 - 44 U/L 8  17  8    $ . Lab Results  Component Value Date   LDH 106 08/09/2022   Hep C neg HIV neg B12 -- low at 222 Myeloma  panel-- no M spike TSH WNL   Labs done 02/20/2022:     RADIOGRAPHIC STUDIES: I have personally reviewed the radiological images as listed and agreed with the findings in the report. No results found.  ASSESSMENT & PLAN:   52 y.o. very pleasant female with:  1. Leukopenia- WBC count 3k minimal neutropenia ANC 1400 2. B-12 Deficiency. B12 -222  Plan  -Discussed lab results from 08/09/22 with patient. CBC showed WBC slightly improved to 3.3 K, hemoglobin of 13, and platelets of 185 K.  Hemoglobin stable, platelets normal -neutropenia is stable and minimal at 1400 -CMP stable -LDH normal -Slightly low B12 levels, though not worsening -No indication suggesting any bone marrow abnormality at this time -Discussed options to either continue to monitor 6 months-1 year or receive a bone marrow biopsy, though a bone marrow biopsy not strongly suggested at this time -Patient would like to continue with B12 and B complex vitamins and return with labs in 6 months -answered patient's questions regarding any effect of regular exercise on her blood count -advised pt to contact us if she experiences any new symptoms . No orders of the defined types were placed in this encounter.   FOLLOW-UP: RTC with Dr Irene Limbo with labs in 6 months  The total time spent in the appointment was *** minutes* .  All of the patient's questions were answered with apparent satisfaction. The patient knows to call the clinic with any problems, questions or concerns.   Sullivan Lone MD MS AAHIVMS [facility] Hematology/Oncology Physician [facility]  .*Total Encounter Time as defined by the Centers for Medicare and Medicaid Services includes, in addition to the face-to-face time of a patient visit  (documented in the note above) non-face-to-face time: obtaining and reviewing outside history, ordering and reviewing medications, tests or procedures, care coordination (communications with other health care professionals or caregivers) and documentation in the medical record.   I,Mitra Faeizi,acting as a Education administrator for Sullivan Lone, MD.,have documented all relevant documentation on the behalf of Sullivan Lone, MD,as directed by  Sullivan Lone, MD while in the presence of Sullivan Lone, MD.  ***

## 2022-08-14 ENCOUNTER — Telehealth: Payer: Self-pay | Admitting: Hematology

## 2022-08-14 NOTE — Telephone Encounter (Signed)
Called patient per 2/16 los notes to schedule f/u. Patient scheduled and notified.

## 2023-02-01 ENCOUNTER — Other Ambulatory Visit: Payer: Self-pay

## 2023-02-01 DIAGNOSIS — D709 Neutropenia, unspecified: Secondary | ICD-10-CM

## 2023-02-02 ENCOUNTER — Inpatient Hospital Stay: Payer: 59 | Attending: Hematology

## 2023-02-02 ENCOUNTER — Inpatient Hospital Stay (HOSPITAL_BASED_OUTPATIENT_CLINIC_OR_DEPARTMENT_OTHER): Payer: 59 | Admitting: Hematology

## 2023-02-02 VITALS — BP 124/68 | HR 65 | Temp 98.3°F | Resp 16 | Wt 177.0 lb

## 2023-02-02 DIAGNOSIS — D72819 Decreased white blood cell count, unspecified: Secondary | ICD-10-CM | POA: Insufficient documentation

## 2023-02-02 DIAGNOSIS — D709 Neutropenia, unspecified: Secondary | ICD-10-CM

## 2023-02-02 DIAGNOSIS — E538 Deficiency of other specified B group vitamins: Secondary | ICD-10-CM | POA: Insufficient documentation

## 2023-02-02 LAB — CBC WITH DIFFERENTIAL (CANCER CENTER ONLY)
Abs Immature Granulocytes: 0 10*3/uL (ref 0.00–0.07)
Basophils Absolute: 0 10*3/uL (ref 0.0–0.1)
Basophils Relative: 0 %
Eosinophils Absolute: 0.1 10*3/uL (ref 0.0–0.5)
Eosinophils Relative: 2 %
HCT: 36.5 % (ref 36.0–46.0)
Hemoglobin: 12.6 g/dL (ref 12.0–15.0)
Immature Granulocytes: 0 %
Lymphocytes Relative: 48 %
Lymphs Abs: 1.5 10*3/uL (ref 0.7–4.0)
MCH: 30.5 pg (ref 26.0–34.0)
MCHC: 34.5 g/dL (ref 30.0–36.0)
MCV: 88.4 fL (ref 80.0–100.0)
Monocytes Absolute: 0.3 10*3/uL (ref 0.1–1.0)
Monocytes Relative: 8 %
Neutro Abs: 1.3 10*3/uL — ABNORMAL LOW (ref 1.7–7.7)
Neutrophils Relative %: 42 %
Platelet Count: 179 10*3/uL (ref 150–400)
RBC: 4.13 MIL/uL (ref 3.87–5.11)
RDW: 12.8 % (ref 11.5–15.5)
WBC Count: 3.1 10*3/uL — ABNORMAL LOW (ref 4.0–10.5)
nRBC: 0 % (ref 0.0–0.2)

## 2023-02-02 LAB — CMP (CANCER CENTER ONLY)
ALT: 6 U/L (ref 0–44)
AST: 13 U/L — ABNORMAL LOW (ref 15–41)
Albumin: 4.1 g/dL (ref 3.5–5.0)
Alkaline Phosphatase: 56 U/L (ref 38–126)
Anion gap: 5 (ref 5–15)
BUN: 10 mg/dL (ref 6–20)
CO2: 29 mmol/L (ref 22–32)
Calcium: 9.2 mg/dL (ref 8.9–10.3)
Chloride: 106 mmol/L (ref 98–111)
Creatinine: 1.07 mg/dL — ABNORMAL HIGH (ref 0.44–1.00)
GFR, Estimated: >60 mL/min (ref >60)
Glucose, Bld: 88 mg/dL (ref 70–99)
Potassium: 4.2 mmol/L (ref 3.5–5.1)
Sodium: 140 mmol/L (ref 135–145)
Total Bilirubin: 1.2 mg/dL (ref 0.3–1.2)
Total Protein: 7 g/dL (ref 6.5–8.1)

## 2023-02-02 LAB — LACTATE DEHYDROGENASE: LDH: 122 U/L (ref 98–192)

## 2023-02-02 NOTE — Progress Notes (Signed)
HEMATOLOGY/ONCOLOGY CLINIC VISIT NOTE   Date of Service: 02/02/2023  Patient Care Team: Devra Dopp, MD as PCP - General (Family Medicine)  CHIEF COMPLAINTS/PURPOSE OF CONSULTATION:  Follow-up for continued evaluation and management of leukopenia  HISTORY OF PRESENTING ILLNESS:   [name] is a wonderful 52 y.o. female who has been referred to [place] by Dr Devra Dopp, MD for evaluation and consultation for recent labs which showed leukopenia. She reports She is doing well.   She notes her WBC count has previously been around 4k. Today it is 2.4k. She notes some minor fatigue recently but otherwise says she feels fine. No nausea or vomiting.    She changed her diet to vegetarian for the last 30 days and previously ate meat.   No recent OTC pain medications/NSAIDS.   We discussed getting labs today for further evaluation which she is agreeable to.   We discussed treatment options and possible etiologies and will continue to evaluate further.   No history of smoking.   No new or recurrent infection issues. No other new or acute focal symptoms.   Labs done 02/20/2022 were reviewed in detail.  INTERVAL HISTORY  Christy Rice is a 52 y.o. female here for continued evaluation and management of leukopenia.   Patient was last seen by me on 08/11/2022 and reported seasonal allergies, but was otherwise doing well overall with no new medical concerns.   Today, she reports that she is doing well overall since her last clincal visit. She received surgery on June 12 and denies any concern for infection. The procedure did improved her neck pain. Patient has been off of movement restriction since 7/30.   She denies any new skin rashes, new joint pain, enlarged lymph nodes/nodules, change in breathing, or SOB. She has been taking B12 and B complex regularly.  Patient denies any abdominal pain, changes in bowel habits, or leg swelling. Patient denies any alcohol  consumption.   MEDICAL HISTORY:  Past Medical History:  Diagnosis Date   Anemia    Thyroiditis, subacute     SURGICAL HISTORY: Past Surgical History:  Procedure Laterality Date   MYOMECTOMY  2009   MYOMECTOMY N/A 09/21/2015   Procedure: ABDOMINAL MYOMECTOMY;  Surgeon: Candice Camp, MD;  Location: [facility];  Service: Gynecology;  Laterality: N/A;   SALPINGOOPHORECTOMY  09/21/2015   Procedure: Left SALPINGO OOPHORECTOMY;  Surgeon: Candice Camp, MD;  Location: [facility];  Service: Gynecology;;   WISDOM TOOTH EXTRACTION  1992    SOCIAL HISTORY: Social History   Socioeconomic History   Marital status: Single    Spouse name: Not on file   Number of children: Not on file   Years of education: Not on file   Highest education level: Not on file  Occupational History   Not on file  Tobacco Use   Smoking status: Never   Smokeless tobacco: Never  Substance and Sexual Activity   Alcohol use: No   Drug use: No   Sexual activity: Yes    Birth control/protection: None  Other Topics Concern   Not on file  Social History Narrative   Not on file   Social Determinants of Health   Financial Resource Strain: Not on file  Food Insecurity: No Food Insecurity (12/14/2020)   Received from [facility]   Hunger Vital Sign    Worried About Running Out of Food in the Last Year: Never true    Ran Out of Food in the Last Year:  Never true  Transportation Needs: Not on file  Physical Activity: Not on file  Stress: Not on file  Social Connections: Moderately Integrated (02/19/2022)   [facility] from [facility], [facility]   Social Network    How would you rate your social network (family, work, friends)?: Adequate participation with social networks  Intimate Partner Violence: Not At Risk (02/19/2022)   Received from [facility]   HITS    Over the last 12 months how often did your partner physically hurt you?: 1    Over the last 12 months how often did your partner  insult you or talk down to you?: 1    Over the last 12 months how often did your partner threaten you with physical harm?: 1    Over the last 12 months how often did your partner scream or curse at you?: 1    FAMILY HISTORY: No family history on file. Fhx: Maternal grandmother breast cancer diagnosed at age 27, died at 42. Paternal lupus, sarcoidosis.  ALLERGIES:  is allergic to sulfa antibiotics.  MEDICATIONS:  Current Outpatient Medications  Medication Sig Dispense Refill   Calcium 500 MG CHEW Chew 1 tablet by mouth 3 (three) times a week.     diclofenac sodium (VOLTAREN) 1 % GEL PLACE ONTO THE SKIN 4 TIMES A DAY AS NEEDED (4 6 GRAMS) 1 Tube 2   Ergocalciferol (VITAMIN D2) 2000 units TABS Take 1 tablet by mouth daily.     fluticasone (FLONASE) 50 MCG/ACT nasal spray Place 2 sprays into both nostrils daily. (Patient not taking: Reported on 08/01/2018) 9.9 g 2   Multiple Vitamin (MULTIVITAMIN) tablet Take 1 tablet by mouth daily.     ondansetron (ZOFRAN) 4 MG tablet TAKE 1 TABLET BY MOUTH EVERY 8 HOURS AS NEEDED FOR NAUSEA FOR UP TO 7 DAYS     orphenadrine (NORFLEX) 100 MG tablet TAKE 1 TABLET BY MOUTH TWICE DAILY AS NEEDED FOR UP TO 10 DAYS     oxyCODONE-acetaminophen (PERCOCET/ROXICET) 5-325 MG tablet Take 1-2 tablets by mouth every 4 (four) hours as needed for severe pain (moderate to severe pain (when tolerating fluids)). (Patient not taking: Reported on 08/01/2018) 30 tablet 0   predniSONE (STERAPRED UNI-PAK 48 TAB) 10 MG (48) TBPK tablet TAKE BY MOUTH DAILY FOR 12 DAYS AS DIRECTED. YOU MAKE TAKE EACH DAILY DOSE IN FULL UNLESS CAUSES STOMACH UPSET. IF YOU DEVELOP STOMACH UPSET     No current facility-administered medications for this visit.    REVIEW OF SYSTEMS:    10 Point review of Systems was done is negative except as noted above.   PHYSICAL EXAMINATION: .BP 124/68 (BP Location: Right Arm, Patient Position: Sitting)   Pulse 65   Temp 98.3 F (36.8 C) (Oral)   Resp 16   Wt  177 lb (80.3 kg)   SpO2 100%   BMI 29.45 kg/m   GENERAL:alert, in no acute distress and comfortable SKIN: no acute rashes, no significant lesions EYES: conjunctiva are pink and non-injected, sclera anicteric OROPHARYNX: MMM, no exudates, no oropharyngeal erythema or ulceration NECK: supple, no JVD LYMPH:  no palpable lymphadenopathy in the cervical, axillary or inguinal regions LUNGS: clear to auscultation b/l with normal respiratory effort HEART: regular rate & rhythm ABDOMEN:  normoactive bowel sounds , non tender, not distended. Extremity: no pedal edema PSYCH: alert & oriented x 3 with fluent speech NEURO: no focal motor/sensory deficits   LABORATORY DATA:  I have reviewed the data as listed .  Latest Ref Rng & Units 02/02/2023   12:34 PM 08/09/2022    3:29 PM 03/28/2022    3:38 PM  CBC  WBC 4.0 - 10.5 K/uL 3.1  3.3  3.0   Hemoglobin 12.0 - 15.0 g/dL 16.1  09.6  04.5   Hematocrit 36.0 - 46.0 % 36.5  37.7  39.4    38.4   Platelets 150 - 400 K/uL 179  185  175    .ANC 1400     Latest Ref Rng & Units 02/02/2023   12:34 PM 08/09/2022    3:29 PM 03/28/2022    3:38 PM  CMP  Glucose 70 - 99 mg/dL 88  82  86   BUN 6 - 20 mg/dL 10  11  7    Creatinine 0.44 - 1.00 mg/dL 4.09  8.11  9.14   Sodium 135 - 145 mmol/L 140  139  141   Potassium 3.5 - 5.1 mmol/L 4.2  4.0  3.8   Chloride 98 - 111 mmol/L 106  104  106   CO2 22 - 32 mmol/L 29  28  32   Calcium 8.9 - 10.3 mg/dL 9.2  8.8  9.1   Total Protein 6.5 - 8.1 g/dL 7.0  7.2  7.0   Total Bilirubin 0.3 - 1.2 mg/dL 1.2  1.0  1.4   Alkaline Phos 38 - 126 U/L 56  59  61   AST 15 - 41 U/L 13  16  15    ALT 0 - 44 U/L 6  8  17     . Lab Results  Component Value Date   LDH 122 02/02/2023   Hep C neg HIV neg B12 -- low at 222 Myeloma panel-- no M spike TSH WNL   Labs done 02/20/2022:     RADIOGRAPHIC STUDIES: I have personally reviewed the radiological images as listed and agreed with the findings in the report. No  results found.  ASSESSMENT & PLAN:   52 y.o. very pleasant female with:  1. Leukopenia- WBC count 3k minimal neutropenia ANC 1400 2. B-12 Deficiency. B12 -222  Plan  -discussed lab results on 02/02/2023 in detail with patient. CBC showed WBC stable/slightly low at 3.1K, hemoglobin of 12.6, and platelets of 179K. -CMP normal -LDH WNL -WBCs may fluctuate with seasonal allergies or other factors -low WBCs not significantly concerning as not dropping progressively and other blood counts are not also low -most common cause of isolated low neutrophils may include medications, abnormal antibodies, or benign ethnic neutropenia which may be found in certain ancestries -there is a chance that there may be a slow moving bone marrow process though this would not be the most common explanation -likely that this is a benign process given absence of symptoms and presence of stability in blood counts -discussed option to definitive workup, though there is not a significant need for a bone marrow biopsy at this time -most likely that patient's bone marrow is working well overall, and one cell is distributed differently -patient agreeable to continue to monitor at this time -will continue to monitor with labs in 1 year  -will plan to alternate visits with PCP -answered all of patient's questions in detail  -advised patient to connect with [place] if she experiences any bone pain, skin rashes, fever, chills, night sweats, recurrent infections, mouth sores, frequent ulcers, other changes in blood counts, enlarges lymph nodes unexplained by obvious infection, unusual bruising, or significant new fatigue. -continue B12 replacement  -continue B complex vitamins in  case there are any other vitamin deficiencies -avoid medications that may affect blood counts such as acid suppressents for extended periods, OTC NSAID pain medications -advised patient to de-stress, stay active, and eat a well-balanced  diet  FOLLOW-UP: RTC with Dr Candise Che with labs in 12 months  The total time spent in the appointment was 20 minutes* .  All of the patient's questions were answered with apparent satisfaction. The patient knows to call the clinic with any problems, questions or concerns.   Wyvonnia Lora MD MS AAHIVMS [facility] Hematology/Oncology Physician [facility]  .*Total Encounter Time as defined by the Centers for Medicare and Medicaid Services includes, in addition to the face-to-face time of a patient visit (documented in the note above) non-face-to-face time: obtaining and reviewing outside history, ordering and reviewing medications, tests or procedures, care coordination (communications with other health care professionals or caregivers) and documentation in the medical record.    I,Mitra Faeizi,acting as a Neurosurgeon for Wyvonnia Lora, MD.,have documented all relevant documentation on the behalf of Wyvonnia Lora, MD,as directed by  Wyvonnia Lora, MD while in the presence of Wyvonnia Lora, MD.  .I have reviewed the above documentation for accuracy and completeness, and I agree with the above. Johney Maine MD

## 2023-06-12 ENCOUNTER — Ambulatory Visit: Payer: 59 | Admitting: Dermatology

## 2023-11-01 ENCOUNTER — Ambulatory Visit: Payer: 59 | Admitting: Dermatology

## 2024-02-07 ENCOUNTER — Other Ambulatory Visit: Payer: Self-pay

## 2024-02-07 DIAGNOSIS — D709 Neutropenia, unspecified: Secondary | ICD-10-CM

## 2024-02-08 ENCOUNTER — Inpatient Hospital Stay: Payer: 59 | Attending: Hematology

## 2024-02-08 ENCOUNTER — Inpatient Hospital Stay (HOSPITAL_BASED_OUTPATIENT_CLINIC_OR_DEPARTMENT_OTHER): Payer: 59 | Admitting: Hematology

## 2024-02-08 VITALS — BP 114/67 | HR 57 | Temp 97.3°F | Resp 20 | Wt 188.4 lb

## 2024-02-08 DIAGNOSIS — D72819 Decreased white blood cell count, unspecified: Secondary | ICD-10-CM | POA: Diagnosis present

## 2024-02-08 DIAGNOSIS — Z803 Family history of malignant neoplasm of breast: Secondary | ICD-10-CM | POA: Insufficient documentation

## 2024-02-08 DIAGNOSIS — D709 Neutropenia, unspecified: Secondary | ICD-10-CM

## 2024-02-08 LAB — CBC WITH DIFFERENTIAL (CANCER CENTER ONLY)
Abs Immature Granulocytes: 0.01 K/uL (ref 0.00–0.07)
Basophils Absolute: 0 K/uL (ref 0.0–0.1)
Basophils Relative: 0 %
Eosinophils Absolute: 0.1 K/uL (ref 0.0–0.5)
Eosinophils Relative: 2 %
HCT: 39.8 % (ref 36.0–46.0)
Hemoglobin: 13.2 g/dL (ref 12.0–15.0)
Immature Granulocytes: 0 %
Lymphocytes Relative: 44 %
Lymphs Abs: 1.3 K/uL (ref 0.7–4.0)
MCH: 29.1 pg (ref 26.0–34.0)
MCHC: 33.2 g/dL (ref 30.0–36.0)
MCV: 87.9 fL (ref 80.0–100.0)
Monocytes Absolute: 0.3 K/uL (ref 0.1–1.0)
Monocytes Relative: 8 %
Neutro Abs: 1.4 K/uL — ABNORMAL LOW (ref 1.7–7.7)
Neutrophils Relative %: 46 %
Platelet Count: 172 K/uL (ref 150–400)
RBC: 4.53 MIL/uL (ref 3.87–5.11)
RDW: 12.5 % (ref 11.5–15.5)
WBC Count: 3 K/uL — ABNORMAL LOW (ref 4.0–10.5)
nRBC: 0 % (ref 0.0–0.2)

## 2024-02-08 LAB — CMP (CANCER CENTER ONLY)
ALT: 7 U/L (ref 0–44)
AST: 14 U/L — ABNORMAL LOW (ref 15–41)
Albumin: 4.1 g/dL (ref 3.5–5.0)
Alkaline Phosphatase: 67 U/L (ref 38–126)
Anion gap: 5 (ref 5–15)
BUN: 9 mg/dL (ref 6–20)
CO2: 31 mmol/L (ref 22–32)
Calcium: 9 mg/dL (ref 8.9–10.3)
Chloride: 106 mmol/L (ref 98–111)
Creatinine: 1.04 mg/dL — ABNORMAL HIGH (ref 0.44–1.00)
GFR, Estimated: >60 mL/min (ref >60)
Glucose, Bld: 89 mg/dL (ref 70–99)
Potassium: 4.2 mmol/L (ref 3.5–5.1)
Sodium: 142 mmol/L (ref 135–145)
Total Bilirubin: 1.3 mg/dL — ABNORMAL HIGH (ref 0.0–1.2)
Total Protein: 7.1 g/dL (ref 6.5–8.1)

## 2024-02-08 LAB — LACTATE DEHYDROGENASE: LDH: 130 U/L (ref 98–192)

## 2024-02-15 NOTE — Progress Notes (Signed)
 HEMATOLOGY/ONCOLOGY CLINIC VISIT NOTE   Date of Service: .02/08/2024   Patient Care Team: Toribio Jerel MATSU, MD as PCP - General (Family Medicine)  CHIEF COMPLAINTS/PURPOSE OF CONSULTATION:  Follow-up for continued evaluation and management of leukopenia  HISTORY OF PRESENTING ILLNESS:   Christy Rice is a wonderful 53 y.o. female who has been referred to us  by Dr Joeann Meiers, MD for evaluation and consultation for recent labs which showed leukopenia. She reports She is doing well.   She notes her WBC count has previously been around 4k. Today it is 2.4k. She notes some minor fatigue recently but otherwise says she feels fine. No nausea or vomiting.    She changed her diet to vegetarian for the last 30 days and previously ate meat.   No recent OTC pain medications/NSAIDS.   We discussed getting labs today for further evaluation which she is agreeable to.   We discussed treatment options and possible etiologies and will continue to evaluate further.   No history of smoking.   No new or recurrent infection issues. No other new or acute focal symptoms.   Labs done 02/20/2022 were reviewed in detail.  INTERVAL HISTORY  Christy Rice is a 53 y.o. female who is here for continued valuation management of her leukopenia. She notes no acute new symptoms since her last clinic visit about a year ago.  No infection issues.  No new medications. Proceed with her age-appropriate vaccinations. Has been taking her vitamin B complex regularly. Labs done today were discussed with detail with the patient.  MEDICAL HISTORY:  Past Medical History:  Diagnosis Date   Anemia    Thyroiditis, subacute     SURGICAL HISTORY: Past Surgical History:  Procedure Laterality Date   MYOMECTOMY  2009   MYOMECTOMY N/A 09/21/2015   Procedure: ABDOMINAL MYOMECTOMY;  Surgeon: Alm Cook, MD;  Location: [facility];  Service: Gynecology;  Laterality: N/A;   SALPINGOOPHORECTOMY  09/21/2015   Procedure:  Left SALPINGO OOPHORECTOMY;  Surgeon: Alm Cook, MD;  Location: [facility];  Service: Gynecology;;   WISDOM TOOTH EXTRACTION  1992    SOCIAL HISTORY: Social History   Socioeconomic History   Marital status: Single    Spouse name: Not on file   Number of children: Not on file   Years of education: Not on file   Highest education level: Not on file  Occupational History   Not on file  Tobacco Use   Smoking status: Never   Smokeless tobacco: Never  Substance and Sexual Activity   Alcohol use: No   Drug use: No   Sexual activity: Yes    Birth control/protection: None  Other Topics Concern   Not on file  Social History Narrative   Not on file   Social Drivers of Health   Financial Resource Strain: Not on file  Food Insecurity: No Food Insecurity (12/14/2020)   Received from [facility]   Hunger Vital Sign    Within the past 12 months, you worried that your food would run out before you got the money to buy more.: Never true    Within the past 12 months, the food you bought just didn't last and you didn't have money to get more.: Never true  Transportation Needs: Not on file  Physical Activity: Not on file  Stress: Not on file  Social Connections: Unknown (03/02/2023)   Received [facility]   Social Network    Social Network: Not on file  Intimate Partner  Violence: Unknown (03/02/2023)   Received from [facility]   HITS    Physically Hurt: Not on file    Insult or Talk Down To: Not on file    Threaten Physical Harm: Not on file    Scream or Curse: Not on file    FAMILY HISTORY: Fhx: Maternal grandmother breast cancer diagnosed at age 63, died at 1. Paternal lupus, sarcoidosis.  ALLERGIES:  is allergic to sulfa antibiotics.  MEDICATIONS:  Current Outpatient Medications  Medication Sig Dispense Refill   b complex vitamins capsule Take 1 capsule by mouth daily.     vitamin B-12 (CYANOCOBALAMIN) 100 MCG tablet Take 100 mcg by mouth daily.     Ergocalciferol  (VITAMIN D2) 2000 units TABS Take 1 tablet by mouth daily.     No current facility-administered medications for this visit.    REVIEW OF SYSTEMS:   .10 Point review of Systems was done is negative except as noted above.  PHYSICAL EXAMINATION: .BP 114/67   Pulse (!) 57   Temp (!) 97.3 F (36.3 C)   Resp 20   Wt 188 lb 6.4 oz (85.5 kg)   SpO2 99%   BMI 31.35 kg/m  . GENERAL:alert, in no acute distress and comfortable SKIN: no acute rashes, no significant lesions EYES: conjunctiva are pink and non-injected, sclera anicteric OROPHARYNX: MMM, no exudates, no oropharyngeal erythema or ulceration NECK: supple, no JVD LYMPH:  no palpable lymphadenopathy in the cervical, axillary or inguinal regions LUNGS: clear to auscultation b/l with normal respiratory effort HEART: regular rate & rhythm ABDOMEN:  normoactive bowel sounds , non tender, not distended.  No palpable hepatosplenomegaly Extremity: no pedal edema PSYCH: alert & oriented x 3 with fluent speech NEURO: no focal motor/sensory deficits    LABORATORY DATA:  I have reviewed the data as listed .    Latest Ref Rng & Units 02/08/2024   12:37 PM 02/02/2023   12:34 PM 08/09/2022    3:29 PM  CBC  WBC 4.0 - 10.5 K/uL 3.0  3.1  3.3   Hemoglobin 12.0 - 15.0 g/dL 86.7  87.3  86.9   Hematocrit 36.0 - 46.0 % 39.8  36.5  37.7   Platelets 150 - 400 K/uL 172  179  185    .ANC 1400     Latest Ref Rng & Units 02/08/2024   12:37 PM 02/02/2023   12:34 PM 08/09/2022    3:29 PM  CMP  Glucose 70 - 99 mg/dL 89  88  82   BUN 6 - 20 mg/dL 9  10  11    Creatinine 0.44 - 1.00 mg/dL 8.95  8.92  8.95   Sodium 135 - 145 mmol/L 142  140  139   Potassium 3.5 - 5.1 mmol/L 4.2  4.2  4.0   Chloride 98 - 111 mmol/L 106  106  104   CO2 22 - 32 mmol/L 31  29  28    Calcium 8.9 - 10.3 mg/dL 9.0  9.2  8.8   Total Protein 6.5 - 8.1 g/dL 7.1  7.0  7.2   Total Bilirubin 0.0 - 1.2 mg/dL 1.3  1.2  1.0   Alkaline Phos 38 - 126 U/L 67  56  59   AST 15 - 41  U/L 14  13  16    ALT 0 - 44 U/L 7  6  8     . Lab Results  Component Value Date   LDH 130 02/08/2024   Hep C neg HIV neg  B12 -- low at 222 Myeloma panel-- no M spike TSH WNL   Labs done 02/20/2022:     RADIOGRAPHIC STUDIES: I have personally reviewed the radiological images as listed and agreed with the findings in the report. No results found.  ASSESSMENT & PLAN:   53 y.o. very pleasant female with:  1. Leukopenia- WBC count 3k minimal neutropenia ANC 1400 2. B-12 Deficiency. B12 -222  Plan - Discussed lab results in detail with the patient. 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 - She will continue her B12 and be complex replacement -She also continues to take her vitamin D - no clinical symptoms or signs suggestive of an underlying primary bone marrow disorder.  No lymphadenopathy or hepatosplenomegaly..  FOLLOW-UP: RTC with Dr Onesimo with labs in 12 months  The total time spent in the appointment was 20 minutes*.  All of the patient's questions were answered with apparent satisfaction. The patient knows to call the clinic with any problems, questions or concerns.   Emaline Onesimo MD MS AAHIVMS [facility] Hematology/Oncology Physician [facility]  .*Total Encounter Time as defined by the Centers for Medicare and Medicaid Services includes, in addition to the face-to-face time of a patient visit (documented in the note above) non-face-to-face time: obtaining and reviewing outside history, ordering and reviewing medications, tests or procedures, care coordination (communications with other health care professionals or caregivers) and documentation in the medical record.

## 2025-02-13 ENCOUNTER — Other Ambulatory Visit

## 2025-02-13 ENCOUNTER — Ambulatory Visit: Admitting: Hematology

## 2025-02-20 ENCOUNTER — Ambulatory Visit: Admitting: Hematology

## 2025-02-20 ENCOUNTER — Other Ambulatory Visit
